# Patient Record
Sex: Female | Born: 1955 | Race: Black or African American | Hispanic: No | Marital: Single | Smoking: Never smoker
Health system: Southern US, Community
[De-identification: ages and names within clinical notes are randomized; demographics above are authoritative.]

## PROBLEM LIST (undated history)

## (undated) DIAGNOSIS — K449 Diaphragmatic hernia without obstruction or gangrene: Secondary | ICD-10-CM

## (undated) DIAGNOSIS — J40 Bronchitis, not specified as acute or chronic: Secondary | ICD-10-CM

## (undated) DIAGNOSIS — F329 Major depressive disorder, single episode, unspecified: Secondary | ICD-10-CM

## (undated) DIAGNOSIS — F32A Depression, unspecified: Secondary | ICD-10-CM

## (undated) DIAGNOSIS — I10 Essential (primary) hypertension: Secondary | ICD-10-CM

## (undated) DIAGNOSIS — D649 Anemia, unspecified: Secondary | ICD-10-CM

## (undated) DIAGNOSIS — E119 Type 2 diabetes mellitus without complications: Secondary | ICD-10-CM

## (undated) DIAGNOSIS — M549 Dorsalgia, unspecified: Secondary | ICD-10-CM

## (undated) DIAGNOSIS — G43909 Migraine, unspecified, not intractable, without status migrainosus: Secondary | ICD-10-CM

## (undated) DIAGNOSIS — M1990 Unspecified osteoarthritis, unspecified site: Secondary | ICD-10-CM

## (undated) DIAGNOSIS — E785 Hyperlipidemia, unspecified: Secondary | ICD-10-CM

## (undated) HISTORY — PX: ABDOMINAL HYSTERECTOMY: SHX81

## (undated) HISTORY — DX: Type 2 diabetes mellitus without complications: E11.9

## (undated) HISTORY — DX: Morbid (severe) obesity due to excess calories: E66.01

## (undated) HISTORY — DX: Migraine, unspecified, not intractable, without status migrainosus: G43.909

## (undated) HISTORY — DX: Bronchitis, not specified as acute or chronic: J40

## (undated) HISTORY — DX: Essential (primary) hypertension: I10

## (undated) HISTORY — DX: Hyperlipidemia, unspecified: E78.5

## (undated) HISTORY — DX: Anemia, unspecified: D64.9

## (undated) HISTORY — PX: TUBAL LIGATION: SHX77

## (undated) HISTORY — DX: Major depressive disorder, single episode, unspecified: F32.9

## (undated) HISTORY — DX: Diaphragmatic hernia without obstruction or gangrene: K44.9

## (undated) HISTORY — DX: Depression, unspecified: F32.A

---

## 2005-12-14 ENCOUNTER — Inpatient Hospital Stay (HOSPITAL_COMMUNITY): Admission: EM | Admit: 2005-12-14 | Discharge: 2005-12-17 | Payer: Self-pay | Admitting: Emergency Medicine

## 2005-12-15 ENCOUNTER — Encounter (INDEPENDENT_AMBULATORY_CARE_PROVIDER_SITE_OTHER): Payer: Self-pay | Admitting: *Deleted

## 2006-01-25 ENCOUNTER — Emergency Department (HOSPITAL_COMMUNITY): Admission: EM | Admit: 2006-01-25 | Discharge: 2006-01-25 | Payer: Self-pay | Admitting: Emergency Medicine

## 2006-03-29 ENCOUNTER — Emergency Department (HOSPITAL_COMMUNITY): Admission: EM | Admit: 2006-03-29 | Discharge: 2006-03-29 | Payer: Self-pay | Admitting: Emergency Medicine

## 2006-07-21 ENCOUNTER — Inpatient Hospital Stay (HOSPITAL_BASED_OUTPATIENT_CLINIC_OR_DEPARTMENT_OTHER): Admission: RE | Admit: 2006-07-21 | Discharge: 2006-07-21 | Payer: Self-pay | Admitting: Cardiology

## 2006-10-02 ENCOUNTER — Encounter: Admission: RE | Admit: 2006-10-02 | Discharge: 2006-12-31 | Payer: Self-pay | Admitting: Family Medicine

## 2006-10-30 ENCOUNTER — Encounter: Payer: Self-pay | Admitting: Vascular Surgery

## 2006-10-30 ENCOUNTER — Ambulatory Visit (HOSPITAL_COMMUNITY): Admission: RE | Admit: 2006-10-30 | Discharge: 2006-10-30 | Payer: Self-pay | Admitting: Cardiology

## 2006-11-17 ENCOUNTER — Encounter: Payer: Self-pay | Admitting: Vascular Surgery

## 2007-02-20 ENCOUNTER — Encounter: Admission: RE | Admit: 2007-02-20 | Discharge: 2007-05-21 | Payer: Self-pay | Admitting: Family Medicine

## 2007-05-22 ENCOUNTER — Encounter: Admission: RE | Admit: 2007-05-22 | Discharge: 2007-05-22 | Payer: Self-pay | Admitting: Family Medicine

## 2007-08-30 ENCOUNTER — Encounter: Admission: RE | Admit: 2007-08-30 | Discharge: 2007-08-30 | Payer: Self-pay | Admitting: Family Medicine

## 2007-12-31 ENCOUNTER — Encounter: Admission: RE | Admit: 2007-12-31 | Discharge: 2007-12-31 | Payer: Self-pay | Admitting: Family Medicine

## 2008-03-14 ENCOUNTER — Ambulatory Visit (HOSPITAL_BASED_OUTPATIENT_CLINIC_OR_DEPARTMENT_OTHER): Admission: RE | Admit: 2008-03-14 | Discharge: 2008-03-14 | Payer: Self-pay | Admitting: Orthopedic Surgery

## 2008-05-07 ENCOUNTER — Encounter: Admission: RE | Admit: 2008-05-07 | Discharge: 2008-05-07 | Payer: Self-pay | Admitting: Family Medicine

## 2010-05-17 LAB — HM COLONOSCOPY

## 2011-01-28 ENCOUNTER — Ambulatory Visit (INDEPENDENT_AMBULATORY_CARE_PROVIDER_SITE_OTHER): Payer: BC Managed Care – PPO | Admitting: Cardiology

## 2011-01-28 DIAGNOSIS — I10 Essential (primary) hypertension: Secondary | ICD-10-CM

## 2011-01-28 DIAGNOSIS — R0789 Other chest pain: Secondary | ICD-10-CM

## 2011-04-12 NOTE — Op Note (Signed)
NAMEJULIAN, ASKIN            ACCOUNT NO.:  [id number]   MEDICAL RECORD NO.:  [id number]          PATIENT TYPE:  AMB   LOCATION:  DSC                          FACILITY:  [facility]   PHYSICIAN:  Harvie Junior, M.D.   DATE OF BIRTH:  07-Sep-1956   DATE OF PROCEDURE:  03/14/2008  DATE OF DISCHARGE:                               OPERATIVE REPORT   PREOPERATIVE DIAGNOSIS:  Medial meniscal tear.   POSTOPERATIVE DIAGNOSES:  1. Medial meniscal tear.  2. Chondromalacia of the patellofemoral joint, particularly      patellofemoral trochlear.   OPERATIVE PROCEDURE:  1. Partial posterior horn medial meniscectomy with corresponding      debridement of the medial femoral compartment.  2. Chondroplasty of the patellofemoral joint, particularly      patellofemoral trochlea.   SURGEON:  Harvie Junior, M.D.   ASSISTANT:  Marshia Ly, P.A.   ANESTHESIA:  General.   BRIEF HISTORY:  Mrs. Griffitts is a 55 year old female with a long history  of having had significant medial joint line complaints.  We had treated  her conservatively for period of time because failure of all  conservative care.  She was also taken to the operating room for  operative knee arthroscopy.  Preoperative MRI showed medial meniscal  tear and this is what we came to address.   PROCEDURE:  The patient was taken to the operating room.  After adequate  anesthesia was obtained with general anesthetic, the patient was placed  supine on the operating table.  Her right leg was prepped and draped in  the usual sterile fashion.  Following this, routine arthroscopic  examination of knee revealed there was an obvious posterior horn medial  meniscal tear which was debrided back and was smoothened with a stable  rim.  Once this completed, the medial femoral condyle was evaluated and  noted to have significant chondromalacia grade 2 in nature.  This was  debrided.  ACL was normal.  Lateral side normal.  Attention was then  turned  up to the patellofemoral joint where there was some grade 2 and  grade 3 changes of the patellofemoral joint, in particular lateral  patellar facet and both medial and lateral femoral trochlear.  This was  debrided via chondroplasty.  There was a large medial plaque which was  debrided all the way around the knee joint.  At this point, the knee was  copiously and thoroughly irrigated and  suctioned dry.  All subportals were closed with a bandage.  Sterile  compression dressing was applied.  The patient was taken to the recovery  room and was noted to be in satisfactory condition.  Estimated blood  loss for this procedure was none.      Harvie Junior, M.D.  Electronically Signed     JLG/MEDQ  D:  03/14/2008  T:  03/15/2008  Job:  [id number]

## 2011-04-15 NOTE — Cardiovascular Report (Signed)
NAMETENIKA, KEERAN            ACCOUNT NO.:  [id number]   MEDICAL RECORD NO.:  [id number]          PATIENT TYPE:  OIB   LOCATION:  1961                         FACILITY:  [facility]   PHYSICIAN:  Colleen Can. Deborah Chalk, M.D.DATE OF BIRTH:  [birth year]   DATE OF PROCEDURE:  07/21/2006  DATE OF DISCHARGE:                              CARDIAC CATHETERIZATION   HISTORY:  Ms. Zehring presents for evaluation of an abnormal adenosine  Cardiolite study with an anterior perfusion defect.  She has a multitude of  cardiovascular risk factors including hypertension, diabetes, previous  stroke and obesity.   PROCEDURE:  Left heart catheterization with selective coronary angiography,  left ventricular angiography with AngioSeal.   TYPE AND SITE OF ENTRY:  Percutaneous, right femoral artery.   CATHETERS:  The 4-French four-curved Judkins right and left coronary  catheters, 4-French pigtail ventriculographic catheter.  We replaced the 4-  [place] system with a 5-French sheath and used Angio-Seal for closure.   CONTRAST MATERIAL:  Omnipaque.   MEDICATIONS GIVEN PRIOR TO PROCEDURE:  Valium 10 mg.   MEDICATIONS GIVEN DURING PROCEDURE:  Versed 4 mg IV.   COMMENTS:  The patient tolerated procedure well.   HEMODYNAMIC DATA:  The aortic pressure was 144/76.  LV is 133/7-12.   ANGIOGRAPHIC DATA:  Left ventricular angiogram was performed in the RAO  position.  The overall cardiac silhouette had somewhat of a slipper-like  appearance with hyperdynamic function in the midportion of the ventricle,  but we could detect no intracavitary gradient.  Overall regional wall motion  was normal.  The global ejection fraction would be 70%.  There is no mitral  regurgitation, intracardiac calcification or intracavitary filling defect.   CORONARY ARTERIES:  The coronary arteries arise and distribute normally.   1. The right coronary artery is a dominant vessel.  It is normal.  2. Left circumflex:  The left circumflex is  a relatively large vessel.  It      continues as a trifurcating posterolateral branch.  It is normal.  3. Left main coronary artery is very short and it is almost a dual ostium      for the left system.  4. Left anterior descending:  The left anterior descending is a long      vessel that wraps around the apex.  There are multiple diagonal      vessels.  The left anterior descending system is normal.   OVERALL IMPRESSION:  1. Normal coronary arteries.  2. Hyperdynamic left ventricle.   DISCUSSION:  It is felt that Mr. Birnie is at low risk from a coronary  standpoint this point in time.  She will need to continue to modify  cardiovascular risk factors, however.      Colleen Can. Deborah Chalk, M.D.  Electronically Signed     SNT/MEDQ  D:  07/21/2006  T:  07/21/2006  Job:  [id number]   cc:   Birdena Jubilee, MD

## 2011-04-15 NOTE — H&P (Signed)
Chelsey Bray, Chelsey Bray            ACCOUNT NO.:  [id number]   MEDICAL RECORD NO.:  [id number]           PATIENT TYPE:   LOCATION:                                 FACILITY:   PHYSICIAN:  Colleen Can. Deborah Chalk, M.D.DATE OF BIRTH:  January 18, 1956   DATE OF ADMISSION:  07/21/2006  DATE OF DISCHARGE:                                HISTORY & PHYSICAL   CHIEF COMPLAINT:  None.   HISTORY OF PRESENT ILLNESS:  The patient is a pleasant 55 year old single  black female who presents for elective cardiac catheterization. She has had  a recent abnormal adenosine Cardiolite study which anterior perfusion  defects with normal LV function. Clinically, she has had no complaints of  chest pain. She has had a recent CVA which involved the right side. She has  had some persistent slower speech. She is now referred for elective cardiac  catheterization.   PAST MEDICAL HISTORY:  1. 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  2. Longstanding hypertension.  3. Diabetes since 1998.  4. Morbid obesity.  5. Migraine headaches.  6. Hiatal hernia.  7. Bronchitis.  8. Scoliosis with Harrington rod placement in 1973.  9. History of tubal ligation.   ALLERGIES:  None.   CURRENT MEDICATIONS:  1. Cozaar 100 mg a day.  2. Topamax 100 mg a day.  3. Pravachol 80 mg a day.  4. Allegra 180 a day.  5. Actos 15 a day.  6. Elavil 25 mg at bedtime.  7. Baby aspirin daily.  8. Atacand 32 mg a day.  9. Atenolol chlorthalidone 50/25 daily.  10.Glucophage 500 mg 4 tablets a day.  11.Januvia 100 mg a day.  12.Potassium 20 mEq a day.  13.Niferex 150 a day.  14.Prilosec 20 b.i.d.  15.Relafen 750 b.i.d.  16.Darvocet-N 100 b.i.d.  17.Flexeril 5 mg at bedtime.   FAMILY HISTORY:  Father died at 20 with heart attack and a stroke. Mother is  living at the age of 50. She has 5 sisters and 3 brothers.   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  She has no alcohol or tobacco use. She lives with her mother.   REVIEW OF SYSTEMS:  Basically as noted above. She has had a history of  migraines and has had some problems with stress and muscle spasms. She has  had no recent chest pain or shortness of breath. She was previously engaged,  and her fiance died in the service which has created significant situational  stress.   PHYSICAL EXAMINATION:  GENERAL:  She is a very pleasant, middle-aged female.  She is in no acute distress.  VITAL SIGNS:  Weight 245 pounds. Blood pressure 110/70 sitting, 120/70  standing, heart rate is 68 and regular.  HEENT:  Unremarkable.  NECK:  Full. No JVD.  LUNGS:  Reasonably clear.  HEART:  Shows a regular rhythm without murmur.  ABDOMEN:  Obese.  EXTREMITIES:  Without edema.  NEUROLOGICAL:  Shows no gross focal deficits.  STUDIES:  EKG shows normal sinus rhythm.   Chest x-ray shows the Harrington rod to be in place. Heart size was normal.  Other labs were pending.   OVERALL IMPRESSION:  1. Abnormal adenosine Cardiolite study.  2. Previous stroke with minimal residual deficit.  3. Hypertensive heart disease.  4. Diabetes.  5. Morbid obesity.  6. Scoliosis.  7. History of migraines.  8. Hyperlipidemia, currently on Pravachol.   PLAN:  We will proceed on with diagnostic cardiac catheterization. Procedure  is reviewed including the risks and benefits, and she is willing to proceed  on Friday, March 21, 2006. Her Glucophage will be discontinued as of  Wednesday, March 19, 2006.      Sharlee Blew, N.P.      Colleen Can. Deborah Chalk, M.D.  Electronically Signed    LC/MEDQ  D:  07/18/2006  T:  07/18/2006  Job:  [id number]   cc:   Birdena Jubilee, MD

## 2011-04-15 NOTE — Discharge Summary (Signed)
Chelsey Bray, Chelsey Bray            ACCOUNT NO.:  [id number]   MEDICAL RECORD NO.:  [id number]          PATIENT TYPE:  INP   LOCATION:  3016                         FACILITY:  [facility]   PHYSICIAN:  Pramod P. Pearlean Brownie, MD    DATE OF BIRTH:  [birth year]   DATE OF ADMISSION:  12/14/2005  DATE OF DISCHARGE:  12/17/2005                                 DISCHARGE SUMMARY   ADMISSION DIAGNOSIS:  Right-sided weakness and shoulder pain.   DISCHARGE DIAGNOSES:  1.  Right shoulder pain and weakness and numbness of unclear etiology,      likely non-organic.  2.  Hypertension.  3.  Diabetes.   HOSPITAL COURSE:  Chelsey Bray is a 55 year old African-American lady who was  admitted with sudden onset of pain and tingling on the right side of the  neck and radiating to down the upper extremity as well as heaviness, some  speech difficulties, facial droop, right-sided weakness and numbness. EMS  was called and code stroke was called en route.  On evaluation in emergency  room, initial CT scan was negative for bleed and pain. She had persistent  right-sided weakness and numbness and qualified for tPA. It was  administered. The patient symptoms, however, persisted. The tPA was  administered without any complications. Follow-up CT scan in 24 hours did  not show any hemorrhage. Subsequently, an MRI scan of the brain was obtained  which showed no definite infarct. MRA showed no intracranial high-grade  stenosis. 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 The patient had persistent right-sided numbness during  the hospitalization. She did not clearly split the midline. However, she had  involvement of the forehead as well. She had giveaway weakness in the right  side but there was no definite objective weakness noted. Hoover's sign was  positive. The patient's weakness and numbness were thought to be non-organic  in nature. The rest of the stroke workup included a 2-D  echocardiogram which  showed normal ejection fraction without obvious cardiac source of embolism.  Carotid Doppler showed only mild 40-60% right ICA stenosis. Hemoglobin A1c  was significantly elevated at 8.4. Homocystine was normal at 9. Lipid  profile was normal. UA showed 11-20 red cells with no WBC's. Urine drug  screen was negative. The patient was continued on aspirin for stroke  prevention. She was counseled to maintain tight control of her diabetes and  blood pressure. She was seen on consult by physical, occupational therapy  and was thought to be safe to be discharged home with some home PT and OT.  The patient was discharged home in stable condition on December 17, 2005 and  advised to follow up with her primary physician in two weeks. She was  advised to return to work after one week.   DISCHARGE MEDICATIONS:  1.  Aspirin 81 milligrams a day.  2.  Actos 15 milligrams a day.  3.  Lisinopril 40 milligrams a day.  4.  Nexium 40 milligrams a day.  5.  Metformin 850 milligrams daily.  6.  Atenolol 50 milligrams daily.  7.  Chlorthalidone 25  milligrams a day.  8.  Potassium 10 mEq daily.  9.  Ferrex 150 milligrams a day.   She was advised to increase outpatient stress relaxation activities and lose  weight and maintain diet-controlled diabetes. Arrangements were made for  home health PT and OT.           ______________________________  Sunny Schlein. Pearlean Brownie, MD     PPS/MEDQ  D:  12/17/2005  T:  12/18/2005  Job:  [id number]   cc:   Birdena Jubilee, MD  Fax: [phone]

## 2011-08-23 LAB — BASIC METABOLIC PANEL
Calcium: 10
Chloride: 102
Potassium: 4.6

## 2012-11-06 LAB — HM MAMMOGRAPHY

## 2013-03-14 ENCOUNTER — Telehealth: Payer: Self-pay | Admitting: *Deleted

## 2013-03-14 NOTE — Telephone Encounter (Signed)
PT CALLED SCHED APPT ON THE 22. TRIED TO GET IN ON THE 21ST SHE DID NOT WANT TO COME IN THEN. PT COMPLAINT VERTIGO TOLD PT IF GOT WORSE SHE NEEDED TO GO TO URGENT CARE OR ED.

## 2013-03-19 ENCOUNTER — Encounter: Payer: Self-pay | Admitting: Family Medicine

## 2013-03-19 ENCOUNTER — Ambulatory Visit (INDEPENDENT_AMBULATORY_CARE_PROVIDER_SITE_OTHER): Payer: BC Managed Care – PPO | Admitting: Family Medicine

## 2013-03-19 VITALS — BP 154/83 | HR 86 | Ht 63.5 in | Wt 252.0 lb

## 2013-03-19 DIAGNOSIS — R42 Dizziness and giddiness: Secondary | ICD-10-CM

## 2013-03-19 DIAGNOSIS — I10 Essential (primary) hypertension: Secondary | ICD-10-CM

## 2013-03-19 DIAGNOSIS — IMO0001 Reserved for inherently not codable concepts without codable children: Secondary | ICD-10-CM

## 2013-03-19 DIAGNOSIS — M7989 Other specified soft tissue disorders: Secondary | ICD-10-CM

## 2013-03-19 MED ORDER — DAPAGLIFLOZIN PROPANEDIOL 10 MG PO TABS: 1.0000 | ORAL_TABLET | Freq: Every day | ORAL | Status: DC

## 2013-03-19 MED ORDER — SITAGLIP PHOS-METFORMIN HCL ER 50-1000 MG PO TB24: 1.0000 | ORAL_TABLET | Freq: Two times a day (BID) | ORAL | Status: DC

## 2013-03-19 MED ORDER — FUROSEMIDE 40 MG PO TABS: 40.0000 mg | ORAL_TABLET | Freq: Every day | ORAL | Status: DC

## 2013-03-19 MED ORDER — MECLIZINE HCL 25 MG PO TABS: 25.0000 mg | ORAL_TABLET | Freq: Three times a day (TID) | ORAL | Status: DC | PRN

## 2013-03-19 MED ORDER — GLUCOSE BLOOD VI STRP: ORAL_STRIP | Status: AC

## 2013-03-19 MED ORDER — IRBESARTAN-HYDROCHLOROTHIAZIDE 300-12.5 MG PO TABS: 1.0000 | ORAL_TABLET | Freq: Every day | ORAL | Status: DC

## 2013-03-19 NOTE — Progress Notes (Signed)
Subjective:     Patient ID: Chelsey Bray, female   DOB: 1956/06/17, 57 y.o.   MRN: [id number]  HPI Chelsey Bray is here today to discuss a few issues.  1)  Vertigo:  She has been feeling very dizzy over the past few days.  She feels that her dizziness is worse when she is laying flat.    2)  Foot Swelling:  She is having more swelling than normal in both feet.  She doesn't feel that the Lasix is working as well as it has in the past. She admits that she has not been watching her diet.    3)  Blood Sugar:  She is taking Janumet twice a day along with Novolog and Levemir. She is confused about how she is really supposed to be taking her insulins.    Review of Systems  Constitutional: Positive for fatigue. Negative for appetite change and unexpected weight change.  HENT: Negative for congestion.   Eyes: Negative.   Respiratory: Positive for shortness of breath.   Cardiovascular: Positive for leg swelling.  Gastrointestinal: Negative.   Endocrine: Positive for polydipsia and polyuria.  Genitourinary: Negative for difficulty urinating.  Skin: Negative for rash.  Neurological: Positive for dizziness and headaches.  Psychiatric/Behavioral: Positive for sleep disturbance and dysphoric mood.   Past Medical History  Diagnosis Date  . Diabetes mellitus without complication   . Hypertension   . Anemia   . Bronchitis   . Morbid obesity   . Hiatal hernia   . Migraine headache   . Depression   . Hyperlipidemia    Family History  Problem Relation Age of Onset  . Diabetes Mother   . Heart disease Father   . 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 No distress.  HENT:  Head: Normocephalic.  Eyes: No scleral icterus.  Neck: No thyromegaly present.  Cardiovascular: Normal rate, regular rhythm and normal heart sounds.   Pulmonary/Chest: Effort normal and breath sounds normal.  Abdominal: There is no tenderness.  Musculoskeletal: She exhibits edema. She exhibits no tenderness.  Neurological: She is alert.  Skin: Skin is warm and dry.  Psychiatric: She has a normal mood and affect. Her behavior is normal. Judgment and thought content normal.       Assessment:     Dizziness Type II Diabetes Hypertension Swelling of Limb   Plan:     1)  She is going to add some [place] to her Janumet and insulin.    2)  She was given a prescription for Avalide.  3)  Refilled her Lasix for her leg swelling.    4)  Rx given for Antivert.    TIME 30 MINUTES:  MORE THAN 50 % OF TIME WAS INVOLVED IN COUNSELING.

## 2013-03-19 NOTE — Patient Instructions (Addendum)
1)  Diabetes -   Insulin   A)  Levemir/Lantus  - Is a long acting insulin. You take typically take it once a day at night.  You should probably be on about 50+ units at night.  Start with 20 units and then increase by 3 units every 3 days till your first morning sugar is <120.     B) Novolog - Is a short acting insulin.  You take this before meals and if you notice that your sugars are really high.  A typical dosage is 10-20 units with your meals.    We are going to schedule you with a diabetic educator to help you better control your sugars and to discuss the V-Go system  We'll change you to the Janumet XR 50/1000 and add some Farxiga 5 mg then increase to 10 mg.    2)   Leg Swelling -  Decrease your salt intake; We'll switch you from Exforge to Benicar HCT 40/12.5.  Once the Benicar HCT is gone, get the Avalide 300/12.5 filled. Take Lasix 40 mg per day.  Limit your sodium to <2000 mg per day.     2 Gram Low Sodium Diet A 2 gram sodium diet restricts the amount of sodium in the diet to no more than 2 g or 2000 mg daily. Limiting the amount of sodium is often used to help lower blood pressure. It is important if you have heart, liver, or kidney problems. Many foods contain sodium for flavor and sometimes as a preservative. When the amount of sodium in a diet needs to be low, it is important to know what to look for when choosing foods and drinks. The following includes some information and guidelines to help make it easier for you to adapt to a low sodium diet. QUICK TIPS  Do not add salt to food.  Avoid convenience items and fast food.  Choose unsalted snack foods.  Buy lower sodium products, often labeled as "lower sodium" or "no salt added."  Check food labels to learn how much sodium is in 1 serving.  When eating at a restaurant, ask that your food be prepared with less salt or none, if possible. READING FOOD LABELS FOR SODIUM INFORMATION The nutrition facts label is a good place  to find how much sodium is in foods. Look for products with no more than 500 to 600 mg of sodium per meal and no more than 150 mg per serving. Remember that 2 g = 2000 mg. The food label may also list foods as:  Sodium-free: Less than 5 mg in a serving.  Very low sodium: 35 mg or less in a serving.  Low-sodium: 140 mg or less in a serving.  Light in sodium: 50% less sodium in a serving. For example, if a food that usually has 300 mg of sodium is changed to become light in sodium, it will have 150 mg of sodium.  Reduced sodium: 25% less sodium in a serving. For example, if a food that usually has 400 mg of sodium is changed to reduced sodium, it will have 300 mg of sodium. CHOOSING FOODS Grains  Avoid: Salted crackers and snack items. Some cereals, including instant hot cereals. Bread stuffing and biscuit mixes. Seasoned rice or pasta mixes.  Choose: Unsalted snack items. Low-sodium cereals, oats, puffed wheat and rice, shredded wheat. English muffins and bread. Pasta. Meats  Avoid: Salted, canned, smoked, spiced, pickled meats, including fish and poultry. Bacon, ham, sausage, cold cuts, hot dogs,  anchovies.  Choose: Low-sodium canned tuna and salmon. Fresh or frozen meat, poultry, and fish. Dairy  Avoid: Processed cheese and spreads. Cottage cheese. Buttermilk and condensed milk. Regular cheese.  Choose: Milk. Low-sodium cottage cheese. Yogurt. Sour cream. Low-sodium cheese. Fruits and Vegetables  Avoid: Regular canned vegetables. Regular canned tomato sauce and paste. Frozen vegetables in sauces. Olives. Rosita Fire. Relishes. Sauerkraut.  Choose: Low-sodium canned vegetables. Low-sodium tomato sauce and paste. Frozen or fresh vegetables. Fresh and frozen fruit. Condiments  Avoid: Canned and packaged gravies. Worcestershire sauce. Tartar sauce. Barbecue sauce. Soy sauce. Steak sauce. Ketchup. Onion, garlic, and table salt. Meat flavorings and tenderizers.  Choose: Fresh and dried  herbs and spices. Low-sodium varieties of mustard and ketchup. Lemon juice. Tabasco sauce. Horseradish. SAMPLE 2 GRAM SODIUM MEAL PLAN Breakfast / Sodium (mg)  1 cup low-fat milk / 143 mg  2 slices whole-wheat toast / 270 mg  1 tbs heart-healthy margarine / 153 mg  1 hard-boiled egg / 139 mg  1 small orange / 0 mg Lunch / Sodium (mg)  1 cup raw carrots / 76 mg   cup hummus / 298 mg  1 cup low-fat milk / 143 mg   cup red grapes / 2 mg  1 whole-wheat pita bread / 356 mg Dinner / Sodium (mg)  1 cup whole-wheat pasta / 2 mg  1 cup low-sodium tomato sauce / 73 mg  3 oz lean ground beef / 57 mg  1 small side salad (1 cup raw spinach leaves,  cup cucumber,  cup yellow bell pepper) with 1 tsp olive oil and 1 tsp red wine vinegar / 25 mg Snack / Sodium (mg)  1 container low-fat vanilla yogurt / 107 mg  3 graham cracker squares / 127 mg Nutrient Analysis  Calories: 2033  Protein: 77 g  Carbohydrate: 282 g  Fat: 72 g  Sodium: 1971 mg Document Released: 11/14/2005 Document Revised: 02/06/2012 Document Reviewed: 02/15/2010 [facility] Patient Information 2013 [place], [place]. Diabetes Meal Planning Guide The diabetes meal planning guide is a tool to help you plan your meals and snacks. It is important for people with diabetes to manage their blood glucose (sugar) levels. Choosing the right foods and the right amounts throughout your day will help control your blood glucose. Eating right can even help you improve your blood pressure and reach or maintain a healthy weight. CARBOHYDRATE COUNTING MADE EASY When you eat carbohydrates, they turn to sugar. This raises your blood glucose level. Counting carbohydrates can help you control this level so you feel better. When you plan your meals by counting carbohydrates, you can have more flexibility in what you eat and balance your medicine with your food intake. Carbohydrate counting simply means adding up the total amount of  carbohydrate grams in your meals and snacks. Try to eat about the same amount at each meal. Foods with carbohydrates are listed below. Each portion below is 1 carbohydrate serving or 15 grams of carbohydrates. Ask your dietician how many grams of carbohydrates you should eat at each meal or snack. Grains and Starches  1 slice bread.   English muffin or hotdog/hamburger bun.   cup cold cereal (unsweetened).   cup cooked pasta or rice.   cup starchy vegetables (corn, potatoes, peas, beans, winter squash).  1 tortilla (6 inches).   bagel.  1 waffle or pancake (size of a CD).   cup cooked cereal.  4 to 6 small crackers. *Whole grain is recommended. Fruit  1 cup fresh unsweetened berries,  melon, papaya, pineapple.  1 small fresh fruit.   banana or mango.   cup fruit juice (4 oz unsweetened).   cup canned fruit in natural juice or water.  2 tbs dried fruit.  12 to 15 grapes or cherries. Milk and Yogurt  1 cup fat-free or 1% milk.  1 cup soy milk.  6 oz light yogurt with sugar-free sweetener.  6 oz low-fat soy yogurt.  6 oz plain yogurt. Vegetables  1 cup raw or  cup cooked is counted as 0 carbohydrates or a "free" food.  If you eat 3 or more servings at 1 meal, count them as 1 carbohydrate serving. Other Carbohydrates   oz chips or pretzels.   cup ice cream or frozen yogurt.   cup sherbet or sorbet.  2 inch square cake, no frosting.  1 tbs honey, sugar, jam, jelly, or syrup.  2 small cookies.  3 squares of graham crackers.  3 cups popcorn.  6 crackers.  1 cup broth-based soup.  Count 1 cup casserole or other mixed foods as 2 carbohydrate servings.  Foods with less than 20 calories in a serving may be counted as 0 carbohydrates or a "free" food. You may want to purchase a book or computer software that lists the carbohydrate gram counts of different foods. In addition, the nutrition facts panel on the labels of the foods you eat are  a good source of this information. The label will tell you how big the serving size is and the total number of carbohydrate grams you will be eating per serving. Divide this number by 15 to obtain the number of carbohydrate servings in a portion. Remember, 1 carbohydrate serving equals 15 grams of carbohydrate. SERVING SIZES Measuring foods and serving sizes helps you make sure you are getting the right amount of food. The list below tells how big or small some common serving sizes are.  1 oz.........4 stacked dice.  3 oz........Marland KitchenDeck of cards.  1 tsp.......Marland KitchenTip of little finger.  1 tbs......Marland KitchenMarland KitchenThumb.  2 tbs.......Marland KitchenGolf ball.   cup......Marland KitchenHalf of a fist.  1 cup.......Marland KitchenA fist. SAMPLE DIABETES MEAL PLAN Below is a sample meal plan that includes foods from the grain and starches, dairy, vegetable, fruit, and meat groups. A dietician can individualize a meal plan to fit your calorie needs and tell you the number of servings needed from each food group. However, controlling the total amount of carbohydrates in your meal or snack is more important than making sure you include all of the food groups at every meal. You may interchange carbohydrate containing foods (dairy, starches, and fruits). The meal plan below is an example of a 2000 calorie diet using carbohydrate counting. This meal plan has 17 carbohydrate servings. Breakfast  1 cup oatmeal (2 carb servings).   cup light yogurt (1 carb serving).  1 cup blueberries (1 carb serving).   cup almonds. Snack  1 large apple (2 carb servings).  1 low-fat string cheese stick. Lunch  Chicken breast salad.  1 cup spinach.   cup chopped tomatoes.  2 oz chicken breast, sliced.  2 tbs low-fat [place] dressing.  12 whole-wheat crackers (2 carb servings).  12 to 15 grapes (1 carb serving).  1 cup low-fat milk (1 carb serving). Snack  1 cup carrots.   cup hummus (1 carb serving). Dinner  3 oz broiled salmon.  1 cup brown  rice (3 carb servings). Snack  1  cups steamed broccoli (1 carb serving) drizzled with 1 tsp olive oil  and lemon juice.  1 cup light pudding (2 carb servings). DIABETES MEAL PLANNING WORKSHEET Your dietician can use this worksheet to help you decide how many servings of foods and what types of foods are right for you.  BREAKFAST Food Group and Servings / Carb Servings Grain/Starches __________________________________ Dairy __________________________________________ Vegetable ______________________________________ Fruit ___________________________________________ Meat __________________________________________ Fat ____________________________________________ LUNCH Food Group and Servings / Carb Servings Grain/Starches ___________________________________ Dairy ___________________________________________ Fruit ____________________________________________ Meat ___________________________________________ Fat _____________________________________________ Laural Golden Food Group and Servings / Carb Servings Grain/Starches ___________________________________ Dairy ___________________________________________ Fruit ____________________________________________ Meat ___________________________________________ Fat _____________________________________________ SNACKS Food Group and Servings / Carb Servings Grain/Starches ___________________________________ Dairy ___________________________________________ Vegetable _______________________________________ Fruit ____________________________________________ Meat ___________________________________________ Fat _____________________________________________ DAILY TOTALS Starches _________________________ Vegetable ________________________ Fruit ____________________________ Dairy ____________________________ Meat ____________________________ Fat ______________________________ Document Released: 08/11/2005 Document Revised: 02/06/2012 Document  Reviewed: 06/22/2009 [employer] Patient Information 2013 [place], [place].

## 2013-03-26 ENCOUNTER — Encounter: Payer: Self-pay | Admitting: Family Medicine

## 2013-03-26 DIAGNOSIS — IMO0001 Reserved for inherently not codable concepts without codable children: Secondary | ICD-10-CM | POA: Insufficient documentation

## 2013-03-26 DIAGNOSIS — M7989 Other specified soft tissue disorders: Secondary | ICD-10-CM | POA: Insufficient documentation

## 2013-03-26 DIAGNOSIS — R42 Dizziness and giddiness: Secondary | ICD-10-CM | POA: Insufficient documentation

## 2013-03-26 DIAGNOSIS — I10 Essential (primary) hypertension: Secondary | ICD-10-CM | POA: Insufficient documentation

## 2013-07-01 ENCOUNTER — Encounter: Payer: Self-pay | Admitting: Family Medicine

## 2013-07-01 ENCOUNTER — Ambulatory Visit: Payer: BC Managed Care – PPO | Admitting: Family Medicine

## 2013-07-01 ENCOUNTER — Ambulatory Visit (INDEPENDENT_AMBULATORY_CARE_PROVIDER_SITE_OTHER): Payer: BC Managed Care – PPO | Admitting: Family Medicine

## 2013-07-01 VITALS — BP 131/86 | HR 54 | Wt 250.0 lb

## 2013-07-01 DIAGNOSIS — M79601 Pain in right arm: Secondary | ICD-10-CM

## 2013-07-01 DIAGNOSIS — M79609 Pain in unspecified limb: Secondary | ICD-10-CM

## 2013-07-01 DIAGNOSIS — M542 Cervicalgia: Secondary | ICD-10-CM

## 2013-07-01 DIAGNOSIS — R079 Chest pain, unspecified: Secondary | ICD-10-CM

## 2013-07-01 MED ORDER — METHYLPREDNISOLONE SODIUM SUCC 125 MG IJ SOLR
125.0000 mg | Freq: Once | INTRAMUSCULAR | Status: AC
  Administered 2013-07-01: 125 mg via INTRAMUSCULAR

## 2013-07-01 MED ORDER — CYCLOBENZAPRINE HCL 10 MG PO TABS: ORAL_TABLET | ORAL | Status: DC

## 2013-07-01 MED ORDER — DICLOFENAC SODIUM 1 % TD GEL: 4.0000 g | Freq: Four times a day (QID) | TRANSDERMAL | Status: AC

## 2013-07-01 MED ORDER — KETOROLAC TROMETHAMINE 60 MG/2ML IM SOLN
60.0000 mg | Freq: Once | INTRAMUSCULAR | Status: AC
  Administered 2013-07-01: 60 mg via INTRAMUSCULAR

## 2013-07-01 NOTE — Progress Notes (Signed)
  Subjective:    Patient ID: Chelsey Bray, female    DOB: [birth year], 57 y.o.   MRN: [id number]  HPI  Chelsey Bray is here today complaining of right arm pain and stomach pain with nausea. She woke up this morning feeling this way. She took some Aleve for her pain which has helped. She has had some mild chest pain with some shortness of breath for about 2-3 days. She is not taking most of her regular medications due the cost.    Review of Systems  Respiratory: Positive for shortness of breath.   Cardiovascular: Positive for chest pain.  Gastrointestinal: Positive for nausea and abdominal pain.  Musculoskeletal:       Right Arm Pain    Past Medical History  Diagnosis Date  . Diabetes mellitus without complication   . Hypertension   . Anemia   . Bronchitis   . Morbid obesity   . Hiatal hernia   . Migraine headache   . Depression   . Hyperlipidemia     Family History  Problem Relation Age of Onset  . Diabetes Mother   . Heart disease Father   . 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 Constitutional: She is oriented to person, place, and time.  Eyes: Conjunctivae are normal. No scleral icterus.  Neck: Neck supple. No thyromegaly present.  Cardiovascular: Normal rate, regular rhythm and normal heart sounds.   Pulmonary/Chest: Effort normal and breath sounds normal.  Musculoskeletal: She exhibits no edema and no tenderness.  Lymphadenopathy:    She has no cervical adenopathy.  Neurological: She is alert and oriented to person,  place, and time.  Skin: Skin is warm and dry.  Psychiatric: She has a normal mood and affect. Her behavior is normal. Judgment and thought content normal.          Assessment & Plan:

## 2013-07-09 ENCOUNTER — Other Ambulatory Visit: Payer: Self-pay | Admitting: *Deleted

## 2013-07-09 DIAGNOSIS — I10 Essential (primary) hypertension: Secondary | ICD-10-CM

## 2013-07-09 DIAGNOSIS — E559 Vitamin D deficiency, unspecified: Secondary | ICD-10-CM

## 2013-07-09 DIAGNOSIS — R5383 Other fatigue: Secondary | ICD-10-CM

## 2013-07-09 DIAGNOSIS — E785 Hyperlipidemia, unspecified: Secondary | ICD-10-CM

## 2013-07-10 ENCOUNTER — Other Ambulatory Visit: Payer: BC Managed Care – PPO

## 2013-07-10 LAB — CBC WITH DIFFERENTIAL/PLATELET
Basophils Absolute: 0 10*3/uL (ref 0.0–0.1)
Basophils Relative: 0 % (ref 0–1)
Eosinophils Absolute: 0.1 10*3/uL (ref 0.0–0.7)
Eosinophils Relative: 1 % (ref 0–5)
HCT: 38.6 % (ref 36.0–46.0)
Hemoglobin: 13.1 g/dL (ref 12.0–15.0)
Lymphocytes Relative: 48 % — ABNORMAL HIGH (ref 12–46)
Lymphs Abs: 4.1 10*3/uL — ABNORMAL HIGH (ref 0.7–4.0)
MCH: 29.9 pg (ref 26.0–34.0)
MCHC: 33.9 g/dL (ref 30.0–36.0)
MCV: 88.1 fL (ref 78.0–100.0)
Monocytes Absolute: 0.4 10*3/uL (ref 0.1–1.0)
Monocytes Relative: 5 % (ref 3–12)
Neutro Abs: 4 10*3/uL (ref 1.7–7.7)
Neutrophils Relative %: 46 % (ref 43–77)
Platelets: 230 10*3/uL (ref 150–400)
RBC: 4.38 MIL/uL (ref 3.87–5.11)
RDW: 13.8 % (ref 11.5–15.5)
WBC: 8.6 10*3/uL (ref 4.0–10.5)

## 2013-07-10 LAB — COMPLETE METABOLIC PANEL WITH GFR
ALT: 13 U/L (ref 0–35)
AST: 11 U/L (ref 0–37)
Albumin: 4 g/dL (ref 3.5–5.2)
Alkaline Phosphatase: 138 U/L — ABNORMAL HIGH (ref 39–117)
BUN: 12 mg/dL (ref 6–23)
CO2: 28 mEq/L (ref 19–32)
Calcium: 9.6 mg/dL (ref 8.4–10.5)
Chloride: 100 mEq/L (ref 96–112)
Creat: 0.88 mg/dL (ref 0.50–1.10)
GFR, Est African American: 85 mL/min
GFR, Est Non African American: 74 mL/min
Glucose, Bld: 435 mg/dL — ABNORMAL HIGH (ref 70–99)
Potassium: 4.5 mEq/L (ref 3.5–5.3)
Sodium: 136 mEq/L (ref 135–145)
Total Bilirubin: 0.4 mg/dL (ref 0.3–1.2)
Total Protein: 7.2 g/dL (ref 6.0–8.3)

## 2013-07-10 LAB — LIPID PANEL
Cholesterol: 182 mg/dL (ref 0–200)
HDL: 34 mg/dL — ABNORMAL LOW (ref >39)
LDL Cholesterol: 110 mg/dL — ABNORMAL HIGH (ref 0–99)
Total CHOL/HDL Ratio: 5.4 Ratio
Triglycerides: 188 mg/dL — ABNORMAL HIGH (ref <150)
VLDL: 38 mg/dL (ref 0–40)

## 2013-07-10 LAB — TSH: TSH: 1.477 u[IU]/mL (ref 0.350–4.500)

## 2013-07-11 LAB — VITAMIN D 25 HYDROXY (VIT D DEFICIENCY, FRACTURES): Vit D, 25-Hydroxy: 31 ng/mL (ref 30–89)

## 2013-07-30 ENCOUNTER — Ambulatory Visit (INDEPENDENT_AMBULATORY_CARE_PROVIDER_SITE_OTHER): Payer: BC Managed Care – PPO | Admitting: Family Medicine

## 2013-07-30 ENCOUNTER — Ambulatory Visit: Payer: BC Managed Care – PPO | Admitting: Family Medicine

## 2013-07-30 ENCOUNTER — Encounter: Payer: Self-pay | Admitting: Family Medicine

## 2013-07-30 VITALS — BP 143/83 | HR 83 | Wt 250.0 lb

## 2013-07-30 DIAGNOSIS — I10 Essential (primary) hypertension: Secondary | ICD-10-CM

## 2013-07-30 DIAGNOSIS — E785 Hyperlipidemia, unspecified: Secondary | ICD-10-CM

## 2013-07-30 DIAGNOSIS — F411 Generalized anxiety disorder: Secondary | ICD-10-CM

## 2013-07-30 DIAGNOSIS — IMO0001 Reserved for inherently not codable concepts without codable children: Secondary | ICD-10-CM

## 2013-07-30 DIAGNOSIS — M542 Cervicalgia: Secondary | ICD-10-CM

## 2013-07-30 DIAGNOSIS — J45909 Unspecified asthma, uncomplicated: Secondary | ICD-10-CM

## 2013-07-30 DIAGNOSIS — Z23 Encounter for immunization: Secondary | ICD-10-CM

## 2013-07-30 DIAGNOSIS — R609 Edema, unspecified: Secondary | ICD-10-CM

## 2013-07-30 LAB — POCT GLYCOSYLATED HEMOGLOBIN (HGB A1C): Hemoglobin A1C: 14

## 2013-07-30 MED ORDER — VILAZODONE HCL 40 MG PO TABS: 40.0000 mg | ORAL_TABLET | Freq: Every day | ORAL | Status: DC

## 2013-07-30 MED ORDER — INSULIN ASPART PROT & ASPART (70-30 MIX) 100 UNIT/ML PEN: PEN_INJECTOR | SUBCUTANEOUS | Status: DC

## 2013-07-30 MED ORDER — CYCLOBENZAPRINE HCL 10 MG PO TABS: ORAL_TABLET | ORAL | Status: AC

## 2013-07-30 MED ORDER — AMLODIPINE BESYLATE-VALSARTAN 5-320 MG PO TABS: 1.0000 | ORAL_TABLET | Freq: Every day | ORAL | Status: AC

## 2013-07-30 MED ORDER — FUROSEMIDE 40 MG PO TABS: 40.0000 mg | ORAL_TABLET | Freq: Every day | ORAL | Status: DC

## 2013-07-30 MED ORDER — PRAVASTATIN SODIUM 40 MG PO TABS: 40.0000 mg | ORAL_TABLET | Freq: Every evening | ORAL | Status: AC

## 2013-07-30 MED ORDER — INSULIN PEN NEEDLE 32G X 6 MM MISC: Status: AC

## 2013-07-30 MED ORDER — ALBUTEROL SULFATE HFA 108 (90 BASE) MCG/ACT IN AERS: 2.0000 | INHALATION_SPRAY | Freq: Four times a day (QID) | RESPIRATORY_TRACT | Status: DC | PRN

## 2013-07-30 NOTE — Progress Notes (Signed)
  Subjective:    Patient ID: Chelsey Bray, female    DOB: 1956/07/18, 57 y.o.   MRN: [id number]  HPI  Chelsey Bray is here today to follow up on her recent lab results and to discuss the following conditions:      1)  Type II DM: She just started back taking her Janumet. She was off most of all her meds all summer due to cost. Her A1C today was 14.0.   2)  Hypertension: She is taking her Exforge every other day due to not having money to pay for it.  Her BP is elevated today but she had a stressful day at work.   3)  Asthma: She is needing to get a refill on her Proair. She has been out of it for a while and feels that she needs to have one on hand.     Review of Systems  Constitutional: Negative.   HENT: Negative.   Eyes: Negative.   Respiratory: Negative.   Cardiovascular: Negative.   Gastrointestinal: Negative.   Endocrine: Negative for polydipsia, polyphagia and polyuria.  Allergic/Immunologic: Negative.   Neurological: Negative.   Hematological: Negative.   Psychiatric/Behavioral: Negative.      Past Medical History  Diagnosis Date  . Diabetes mellitus without complication   . Hypertension   . Anemia   . Bronchitis   . Morbid obesity   . Hiatal hernia   . Migraine headache   . Depression   . Hyperlipidemia      Family History  Problem Relation Age of Onset  . Diabetes Mother   . Heart disease Father   . 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 Constitutional: She is oriented to person, place, and time.   Eyes: Conjunctivae are normal. No scleral icterus.  Neck: Neck supple. No thyromegaly present.  Cardiovascular: Normal rate, regular rhythm and normal heart sounds.   Pulmonary/Chest: Effort normal and breath sounds normal.  Musculoskeletal: She exhibits no edema and no tenderness.  Lymphadenopathy:    She has no cervical adenopathy.  Neurological: She is alert and oriented to person, place, and time.  Skin: Skin is warm and dry.  Psychiatric: She has a normal mood and affect. Her behavior is normal. Judgment and thought content normal.          Assessment & Plan:

## 2013-07-30 NOTE — Patient Instructions (Addendum)
1)  Blood Sugar - Take 2 Janumet and 1 Invokana daily until you get paid then switch over to the Novolog 70/30 twice a day.  Start with 20 units twice a day and increase up to 50 units to get your sugars under control. You have to check your blood sugar to know what they are to see if you need to increase your dosage.  We are going to try to get your sugar under control with just insulin.    2)  BP - Take the Exforge daily.  3)  Cholesterol - Take 20 mg of pravastatin daily.    Insulin Treatment in Diabetes Diabetes is a lasting (chronic) disease. It occurs when the body does not properly use the sugar (glucose) that is released from food. Glucose levels are controlled by a hormone called insulin, which is made by your pancreas. Depending on the type of diabetes you have, either:  The pancreas does not make any insulin (type 1 diabetes).  The pancreas makes too little insulin, and the body cannot respond normally to the insulin that is made (type 2 diabetes). Without insulin, death can occur. Diabetes requires lifelong monitoring and treatment. This document will discuss the role of insulin in your treatment and provide information about insulin.  LIFESTYLE CHOICES Lifestyle choices can affect both the control of your diabetes and your risk of complications from diabetes. Lifestyle choices are critically important in the overall management of diabetes. They are important even if you do not need to take insulin.  Eat a healthy diet. Ask for help if you need it.  Exercise regularly. Ask for help if you need it.  Diet and exercise can help:  Reduce the amount of insulin you need.  Your body to use your insulin more effectively.  Reduce your risk of high blood pressure (or reduce your blood pressure, if it is high).  Reduce your cholesterol level.  Reduce your weight. Healthy lifestyle choices play an important role in controlling cardiovascular disease (heart attack, stroke, vascular  disease, and others), which is a primary complication of diabetes. For optimal control of diabetes, you must reduce your cardiovascular risks and manage your blood sugar. MEDICATIONS BESIDES INSULIN  Your caregiver may recommend medicines for you in addition to insulin. This will depend on 3 things:   Your diabetes type.  How well insulin alone meets your treatment goals.  Other health factors. There are different types of medicines that help treat diabetes. The goal is to control your blood sugar the best way possible, which will reduce your risk of complications. Adding other medicines may also reduce the amount of insulin you need.  INSULIN  People with type 1 diabetes must take insulin to stay alive. Their body does not produce it. People with type 2 diabetes might require insulin in addition to, or instead of, other medicines. In either case, proper use of insulin is critical to control your diabetes.  There are a number of different types of insulin. Usually, you will give yourself injections, though others can be trained to give them to you. Some people have an insulin pump that delivers insulin continuously through a soft, flexible tube (canula) that is placedunder the skin of the abdomen.Other sites including the hips, thighs, or upper arms may also be used.  Using insulin requires that you check your blood sugar several times a day. The exact number of times and time of day to check your blood sugar will vary depending on your type of  diabetes, your type of insulin, and treatment goals. Your caregiver will direct you.  Generally, different insulins have different properties. The following is a general guide. Specifics will vary by product, and new products are introduced periodically.   Short-acting insulin starts working quickly (in as little as 5 minutes) and wears off in 3 to 6 hours (sometimes longer). This type of insulin works well when taken before a meal to bring your blood sugar  quickly back to normal. There are several different types of short-acting insulin. Some work quickly and others last longer in your system.  Intermediate-acting insulin starts working in 2 hours and wears off after about 10 to18 hours. This insulin will lower your blood sugar for a longer period of time, but will not be as effective in lowering your blood sugar right after a meal.  Long-acting insulin mimics the small amount of insulin that your pancreas usually produces throughout the day. You need to have some insulin present at all times, as it is crucial to the metabolism of brain cells and other cells. Long-acting insulin is meant to be used either once or twice a day. It is usually used in combination with other types of insulin, or in combination with other diabetes medicines. Discuss the type of insulin you are taking with your caregiver or pharmacist. You will then be aware of when the insulin can be expected to peak and when it will wear off.  Your caregiver will usually have a strategy in mind when treating you with insulin. This will vary with your type of diabetes, your diabetes treatment goals, and your health history. It is important that you understand something about this strategy so you may be a partner in treating your diabetes. Here are some terms you might hear:  Basal insulin. You need to have a small amount of insulin present in your blood at all times. Sometimes oral medicines will be enough. For other people, and especially for people with type 1 diabetes, insulin is needed. Usually, intermediate-acting or long-acting insulin is used once or twice a day to accomplish this.  Prandial (meal-related) insulin. Your blood sugar will rise rapidly after a meal. Short-acting insulin can be used right before the meal to bring your blood sugar back to normal quickly. You might be instructed to adjust the amount of insulin depending on how much carbohydrate (starch) is in your  meal.  Corrective insulin. You might be instructed to check your blood sugar at certain times of the day. You then might use a small amount of short-acting insulin to bring the blood sugar down to normal if it is elevated.  Tight control (also called intensive therapy). Tight control is keeping your blood sugar as close to your target as possible and keeping it from going too high after meals. People with tight control of their diabetes may have fewer long-term complications from their diabetes.  Glycohemoglobin (also called glyco, glycosylated hemoglobin, Hemoglobin A1c, or A1c) level. This measures how well your blood sugar has been controlled during the past 1 to 3 months. It helps your caregiver see how effective your treatment is and decide if any changes are needed. Your caregiver will discuss your target glycohemoglobin with you. Insulin treatment requires your careful attention. Treatment plans will be different for different people. Some people do well with a simple program. Others require more complicated programs with multiple insulin injections daily. You will work with your caregiver to develop the best program for you. Regardless of your insulin  treatment plan, you must also do your best on weight control, diet and food choices, exercise, blood pressure control, and cholesterol control.  BENEFITS OF DIABETES TREATMENT  Research shows that optimal treatment of your diabetes will reduce the risk of kidney, eye, and nerve complications. If you have type 1 diabetes, your risk of heart and vascular disease also decreases with good diabetes control. The better you control your blood sugars (and the lower your glycohemoglobin), the lower your risk of complications. RISKS OF INSULIN Although insulin treatment is important, it does have some risks. Insulin treatment may be complex, but it is critical for maintaining your good health. Frequent follow-up visits with your caregiver, at least early on,  are usually needed.  Insulin can cause your blood sugar to go too low (hypoglycemia). This can be a dangerous complication that must be quickly recognized and treated.  Weight gain can occur.  Improper injection technique can cause hypoglycemia, skin injury or irritation, or other problems. You must learn to inject insulin properly.  Other medicines used for diabetes can have other complications. Discuss your medicines and their complications with your caregiver. GOALS OF DIABETES CARE  The goal of treating your diabetes is to allow you to live as long as you can with as few complications as possible. Several factors help you work towards this goal:  You should achieve a blood sugar as close to normal as possible without causing hypoglycemia. Generally, this means a glycohemoglobin of between 6% and 7%.  You should achieve and maintain an ideal body weight.  You should exercise regularly.  Your blood pressure should be under 130/80.  Your cholesterol should be controlled, with your LDL under 100. Your target may be different depending on your health factors.  You should not smoke.  You should be up to date on immunizations, including influenza and pneumonia.  You should be monitored for eye, kidney, heart, and vascular health regularly. Preventative medicines are sometimes used for these conditions. Your specific goals may vary, depending on your health factors. You should discuss issues with your caregiver.  HOME CARE INSTRUCTIONS   Do not smoke.  Eat a healthy diet as instructed. Ask for help if you need it.  Exercise regularly. Ask for help if you need it.  Stay up to date on your immunizations.  See your caregiver on a regular basis.  Follow your diabetes care plan carefully. Take medicines and insulin as directed. Check your blood sugar as directed, and keep track of it in a log. Understand your glycohemoglobin and other diabetes goals. Understand how to detect and treat  hypoglycemia.  Follow your care plan for blood pressure and elevated cholesterol if you have these problems.  Do your blood tests as directed. This is important and helps monitor your diabetes.  Check your feet every night for sores or wounds.  See your eye doctor once a year.  If you have an illness that causes loss of appetite, vomiting, or diarrhea, you should speak with your caregiver about temporary changes in your insulin doses and other medicines. SEEK MEDICAL CARE IF:  You are having problems keeping your blood sugar at target range.  You are having episodes of hypoglycemia.  You are having side effects from your medicines.  You have symptoms of an illness that are not improving after 3 to 4 days.  You have a sore or wound that is not healing.  You notice a change in vision or a new problem with your vision.  You develop a fever of more than 100.5 F (38.1 C). SEEK IMMEDIATE MEDICAL CARE IF:   Your blood sugar goes below 70, especially if you have confusion, lightheadedness, or other symptoms.  Your blood sugar is very high (as advised by your caregiver) twice in a row.  An unexplained oral temperature above 102 F (38.9 C) develops.  You pass out.  You have chest pain or trouble breathing.  You have a sudden, severe headache.  You have sudden weakness in one arm or leg.  You have sudden difficulty speaking or swallowing.  You develop vomiting or diarrhea that is getting worse or not improving after 1 day. Document Released: 02/10/2009 Document Revised: 02/06/2012 Document Reviewed: 03/10/2011 [facility] Patient Information 2014 [place], [place].

## 2013-08-09 ENCOUNTER — Ambulatory Visit: Payer: BC Managed Care – PPO | Admitting: Family Medicine

## 2013-08-18 DIAGNOSIS — M542 Cervicalgia: Secondary | ICD-10-CM | POA: Insufficient documentation

## 2013-08-18 DIAGNOSIS — R079 Chest pain, unspecified: Secondary | ICD-10-CM | POA: Insufficient documentation

## 2013-08-18 DIAGNOSIS — M79601 Pain in right arm: Secondary | ICD-10-CM | POA: Insufficient documentation

## 2013-08-18 NOTE — Assessment & Plan Note (Signed)
An EKG was done which did not show any acute changes.  She was strongly encouraged to take her prescribed medications.

## 2013-08-18 NOTE — Assessment & Plan Note (Addendum)
She was given a prescription for Diclofenac.

## 2013-08-18 NOTE — Assessment & Plan Note (Signed)
She was given injections of Solu-Medrol and Ketorolac.

## 2013-09-11 ENCOUNTER — Encounter: Payer: Self-pay | Admitting: Cardiology

## 2013-10-05 DIAGNOSIS — F411 Generalized anxiety disorder: Secondary | ICD-10-CM | POA: Insufficient documentation

## 2013-10-05 DIAGNOSIS — Z23 Encounter for immunization: Secondary | ICD-10-CM | POA: Insufficient documentation

## 2013-10-05 DIAGNOSIS — R609 Edema, unspecified: Secondary | ICD-10-CM | POA: Insufficient documentation

## 2013-10-05 DIAGNOSIS — J45909 Unspecified asthma, uncomplicated: Secondary | ICD-10-CM | POA: Insufficient documentation

## 2013-10-05 DIAGNOSIS — E785 Hyperlipidemia, unspecified: Secondary | ICD-10-CM | POA: Insufficient documentation

## 2013-10-05 NOTE — Assessment & Plan Note (Signed)
I reminded Chelsey Bray AGAIN that she should be on a statin given the fact that she has had some "cardiovascular" episodes in the past and also because she is diabetic.  She was given ANOTHER prescription for pravastatin.

## 2013-10-05 NOTE — Assessment & Plan Note (Signed)
The patient confirmed that they are not allergic to eggs and have never had a bad reaction with the flu shot in the past.  The vaccination was given without difficulty.   

## 2013-10-05 NOTE — Assessment & Plan Note (Signed)
Refilled her Exforge that she should be getting for $4.

## 2013-10-05 NOTE — Assessment & Plan Note (Signed)
She was given sample and a refill for her Viibryd.

## 2013-10-05 NOTE — Assessment & Plan Note (Addendum)
I had another very long talk with Chelsey Bray about her VERY UNCONTROLLED BLOOD SUGAR.  I was very frank with her telling her that she is the most UNCONTROLLED patient I have and that I really don't know what to do with her.  My office has set her up with an endocrinologist but she did not go because of the higher co-pay she has to pay when she goes to see a specialist.  The biggest problem that she has is her financial situation.  Even thought she only has to pay $5 for the Janumet and she can get the Invokana for $0 co-pay, she does not appear to be taking these.  At this point, I told her that we need to just try insulin and see what this will do for her.  She is to follow up in 3 months for a recheck of her A1c.

## 2013-10-05 NOTE — Assessment & Plan Note (Signed)
She was given prescriptions for a couple of albuterol inhalers.  She will compare the cost and get whichever one she can afford.

## 2013-10-05 NOTE — Assessment & Plan Note (Signed)
Refilled her Lasix.   

## 2013-10-05 NOTE — Assessment & Plan Note (Signed)
Refilled her Flexeril.   

## 2013-11-01 ENCOUNTER — Encounter: Payer: Self-pay | Admitting: Family Medicine

## 2013-11-01 ENCOUNTER — Ambulatory Visit (INDEPENDENT_AMBULATORY_CARE_PROVIDER_SITE_OTHER): Payer: BC Managed Care – PPO | Admitting: Family Medicine

## 2013-11-01 VITALS — BP 137/77 | HR 77 | Resp 16 | Wt 247.0 lb

## 2013-11-01 DIAGNOSIS — IMO0001 Reserved for inherently not codable concepts without codable children: Secondary | ICD-10-CM

## 2013-11-01 LAB — POCT GLYCOSYLATED HEMOGLOBIN (HGB A1C): Hemoglobin A1C: >14

## 2013-11-01 MED ORDER — SAXAGLIPTIN-METFORMIN ER 2.5-1000 MG PO TB24: 1.0000 | ORAL_TABLET | Freq: Two times a day (BID) | ORAL | Status: DC

## 2013-11-01 MED ORDER — INSULIN ASPART PROT & ASPART (70-30 MIX) 100 UNIT/ML PEN: PEN_INJECTOR | SUBCUTANEOUS | Status: DC

## 2013-11-01 MED ORDER — CANAGLIFLOZIN 300 MG PO TABS: 1.0000 | ORAL_TABLET | Freq: Every day | ORAL | Status: AC

## 2013-11-01 NOTE — Patient Instructions (Addendum)
1)  Type II DM- Your blood sugar remains very uncontrolled.  Let's try the following:    Take 300 mg of Invokana in the am and one Kombiglyze 03/999 at night for 2 weeks.  Get your cards activated and you should get both for free.  After 2 weeks, you are going to take 300 mg of Invokana in the am and then Kombiglyze 2.03/999 twice a day.    Start on the Novolog 70/30  10 units twice a day (15 mins) before breakfast and supper.  Increase the insuln slowly 3 units every 3 days until your sugars are looking better.     Insulin Treatment for Diabetes Diabetes is a disease that does not go away (chronic). It occurs when the body does not properly use the sugar (glucose) that is released from food after it is digested. Glucose levels are controlled by a hormone called insulin, which is made by your pancreas. Depending on the type of diabetes you have, either of the following will apply:   The pancreas does not make any insulin (type 1 diabetes).  The pancreas makes too little insulin, and the body cannot respond normally to the insulin that is made (type 2 diabetes). Without insulin, death can occur. However, with the addition of insulin, blood sugar monitoring, and treatment, someone with diabetes can live a full and productive life. This document will discuss the role of insulin in your treatment and provide information about its use.  HOW IS INSULIN GIVEN? Insulin is a medicine that can only be given by injection. Taking it by mouth makes it inactive because of the acid in your stomach. Insulin is injected under the skin by a syringe and needle, an insulin pen, a pump, or a jet injector. Your dose will be determined by your health care provider based on your individual needs. You will also be given guidance on which method of giving insulin is right for you. Remember that if you give insulin with a needle and syringe, you must do so using only a special insulin syringe made for this purpose. WHERE ON  THE BODY SHOULD INSULIN BE INJECTED? Insulin is injected into the fatty layer of tissue just under your skin. Good places to inject insulin include the upper arm, the front and outer area of the thigh, the hips, and the abdomen. Giving your insulin in the abdomen is preferred because this provides the most rapid and consistent absorption. Avoid the area 2 inches (5 cm) around the navel and avoid injecting into areas on your body with scar tissue. In addition, it is important to rotate your injection sites with every shot to prevent irritation and improve absorption. WHAT ARE THE DIFFERENT TYPES OF INSULIN?  If you have type 1 diabetes, you must take insulin to stay alive. Your body does not produce it. If you have type 2 diabetes, you might require insulin in addition to, or instead of, other medicines. In either case, proper use of insulin is critical to control your diabetes.  There are a number of different types of insulin. Usually, you will give yourself injections, though others can be trained to give them to you. Some people have an insulin pump that delivers insulin continuously through a tube (cannula) that is placed under the skin. Using insulin requires that you check your blood sugar several times a day. The exact number of times and time of day to check will vary depending on your type of diabetes, your type of insulin, and  treatment goals. Your health care provider will direct you.  Generally, different insulins have different properties. The following is a general guide. Specifics will vary by product, and new products are introduced periodically.   Rapid-acting insulin starts working quickly (in as little as 5 minutes) and wears off in 4 to 6 hours (sometimes longer). This type of insulin works well when taken just before a meal to bring your blood sugar quickly back to normal.   Short-acting insulin starts working in about 30 minutes and can last 6 to 10 hours. This type of insulin  should be taken about 30 minutes before you start eating a meal.  Intermediate-acting insulin starts working in 1-2 hours and wears off after about 10 to 18 hours. This insulin will lower your blood sugar for a longer period of time, but it will not be as effective in lowering your blood sugar right after a meal.   Long-acting insulin mimics the small amount of insulin that your pancreas usually produces throughout the day. You need to have some insulin present at all times. It is crucial to the metabolism of brain cells and other cells. Long-acting insulin is meant to be used either once or twice a day. It is usually used in combination with other types of insulin, or in combination with other diabetes medicines.  Discuss the type of insulin you are taking with your health care provider or pharmacist. You will then be aware of when the insulin can be expected to peak and when it will wear off. This is important to know so you can plan for meal times and periods of exercise.  Your health care provider will usually have a strategy in mind when treating you with insulin. This will vary with your type of diabetes, your diabetes treatment goals, and your health history. It is important that you understand this strategy so you can be an active partner in treating your diabetes. Here are some terms you might hear:   Basal insulin. This refers to the small amount of insulin that needs to be present in your blood at all times. Sometimes oral medicines will be enough. For other people, and especially for people with type 1 diabetes, insulin is needed. Usually, intermediate-acting or long-acting insulin is used once or twice a day to accomplish this.   Prandial (meal-related) insulin. Your blood sugar will rise rapidly after a meal. Rapid-acting or short-acting insulin can be used right before the meal to bring your blood sugar back to normal quickly. You might be instructed to adjust the amount of insulin  depending on how much carbohydrate (starch) is in your meal.   Corrective insulin. You might be instructed to check your blood sugar at certain times of the day. You then might use a small amount of rapid-acting or short-acting insulin to bring the blood sugar down to normal if it is elevated.   Tight control (also called intensive therapy). Tight control means keeping your blood sugar as close to your target as possible and keeping it from going too high after meals. People with tight control of their diabetes are shown to have fewer long-term complications from their diabetes.   Glycohemoglobin (also called glyco, glycosylated hemoglobin, hemoglobin A1c, or A1c) level. This measures how well your blood sugar has been controlled during the past 1 to 3 months. It helps your health care provider see how effective your treatment is and decide if any changes are needed. Your health care provider will discuss your target  glycohemoglobin level with you.  Insulin treatment requires your careful attention. Treatment plans will be different for different people. Some people do well with a simple program. Others require more complicated programs, with multiple insulin injections daily. You will work with your health care provider to develop the best program for you. Regardless of your insulin treatment plan, you must also do your best on weight control, diet and food choices, exercise, blood pressure control, cholesterol control, and stress levels.  WHAT ARE THE SIDE EFFECTS OF INSULIN? Although insulin treatment is important, it does have some side effects, such as:   Insulin can cause your blood sugar to go too low (hypoglycemia).   Weight gain can occur.   Improper injection technique can cause hypoglycemia, blood sugar going too high (hyperglycemia), skin injury or irritation, or other problems. You must learn to inject insulin properly. Document Released: 02/10/2009 Document Revised: 07/17/2013  Document Reviewed: 04/28/2013 [facility] Patient Information 2014 [place], [place].

## 2013-11-01 NOTE — Progress Notes (Signed)
Subjective:    Patient ID: Chelsey Bray, female    DOB: [birth year], 57 y.o.   MRN: [id number]  HPI  Chelsey Bray is here today to discuss her Type II DM.  She wanted to be seen because her glucose level was 509 and she was very concerned.  She has been monitoring her levels daily and brought in her readings to be reviewed.  She is not taking any of her medications because she has not had any money to pay for them.     Review of Systems  Eyes: Positive for visual disturbance.  Endocrine: Positive for polyuria.       Dry mounth.  Musculoskeletal:       Lower leg pain and feet   Neurological: Positive for dizziness.  All other systems reviewed and are negative.     Past Medical History  Diagnosis Date  . Diabetes mellitus without complication   . Hypertension   . Anemia   . Bronchitis   . Morbid obesity   . Hiatal hernia   . Migraine headache   . Depression   . Hyperlipidemia      Past Surgical History  Procedure Laterality Date  . Tubal ligation    . 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 Diabetes Mother   . Heart disease Father   . Hypertension Father   . Stroke Father   . Diabetes Sister      Current Outpatient Prescriptions on File Prior to Visit  Medication Sig Dispense Refill  . albuterol (PROAIR HFA) 108 (90 BASE) MCG/ACT inhaler Inhale 2 puffs into the lungs every 6 (six) hours as needed for wheezing or shortness of breath.  1 Inhaler  11  . amLODipine-valsartan (EXFORGE) 5-320 MG per tablet Take 1  tablet by mouth daily.  30 tablet  11  . cyclobenzaprine (FLEXERIL) 10 MG tablet Take 1 tab po QHS for muscle spasm  90 tablet  1  . diclofenac sodium (VOLTAREN) 1 % GEL Apply 4 g topically 4 (four) times daily.  5 Tube  5  . furosemide (LASIX) 40 MG tablet Take 1 tablet (40 mg total) by mouth daily.  90 tablet  3  . glucose blood (ONE TOUCH ULTRA TEST) test strip Use as instructed  100 each  12  . Insulin Pen Needle (NOVOFINE) 32G X 6 MM MISC Use 1 pen needle twice a day  100 each  11  . meclizine (ANTIVERT) 25 MG tablet Take 1 tablet (25 mg total) by mouth 3 (three) times daily as needed for dizziness or nausea.  30 tablet  1  . pravastatin (PRAVACHOL) 40 MG tablet Take 1 tablet (40 mg total) by mouth every evening.  30 tablet  11  . SitaGLIPtin-MetFORMIN HCl (JANUMET XR) 50-1000 MG TB24 Take 1 tablet by mouth 2 (two) times daily.  60 tablet  11  . Vilazodone HCl (VIIBRYD)  40 MG TABS Take 1 tablet (40 mg total) by mouth daily.  30 tablet  3   No current facility-administered medications on file prior to visit.     Allergies  Allergen Reactions  . Ace Inhibitors Cough     Immunization History  Administered Date(s) Administered  . Influenza,inj,Quad PF,36+ Mos 07/30/2013  . Pneumococcal-Unspecified 10/15/2009  . Td 08/13/2004       Objective:   Physical Exam  Vitals reviewed. Constitutional: She is oriented to person, place, and time.  Eyes: Conjunctivae are normal. No scleral icterus.  Neck: Neck supple. No thyromegaly present.  Cardiovascular: Normal rate, regular rhythm and normal heart sounds.   Pulmonary/Chest: Effort normal and breath sounds normal.  Musculoskeletal: She exhibits no edema and no tenderness.  Lymphadenopathy:    She has no cervical adenopathy.  Neurological: She is alert and oriented to person, place, and time.  Skin: Skin is warm and dry.  Psychiatric: She has a normal mood and affect. Her behavior is normal. Judgment and thought content normal.           Assessment & Plan:    Chelsey Bray was seen today for diabetes.  Diagnoses and associated orders for this visit:  Type II or unspecified type diabetes mellitus without mention of complication, uncontrolled - POCT glycosylated hemoglobin (Hb A1C) - Saxagliptin-Metformin 2.03-999 MG TB24; Take 1 tablet by mouth 2 (two) times daily. - Canagliflozin 300 MG TABS; Take 1 tablet (300 mg total) by mouth daily. - Insulin Aspart Prot & Aspart (NOVOLOG MIX 70/30 FLEXPEN) (70-30) 100 UNIT/ML SUPN; Inject up to 35 units up to 15 mins before breakfast and supper

## 2013-11-05 ENCOUNTER — Encounter: Payer: Self-pay | Admitting: *Deleted

## 2013-11-19 ENCOUNTER — Encounter: Payer: Self-pay | Admitting: *Deleted

## 2013-11-19 LAB — HM COLONOSCOPY: HM Colonoscopy: NORMAL

## 2014-02-11 ENCOUNTER — Ambulatory Visit (INDEPENDENT_AMBULATORY_CARE_PROVIDER_SITE_OTHER): Payer: BC Managed Care – PPO | Admitting: Family Medicine

## 2014-02-11 ENCOUNTER — Encounter: Payer: Self-pay | Admitting: Family Medicine

## 2014-02-11 VITALS — BP 153/85 | HR 75 | Resp 16 | Wt 256.0 lb

## 2014-02-11 DIAGNOSIS — G894 Chronic pain syndrome: Secondary | ICD-10-CM

## 2014-02-11 DIAGNOSIS — R05 Cough: Secondary | ICD-10-CM

## 2014-02-11 DIAGNOSIS — R42 Dizziness and giddiness: Secondary | ICD-10-CM

## 2014-02-11 DIAGNOSIS — R609 Edema, unspecified: Secondary | ICD-10-CM

## 2014-02-11 DIAGNOSIS — IMO0001 Reserved for inherently not codable concepts without codable children: Secondary | ICD-10-CM

## 2014-02-11 DIAGNOSIS — I10 Essential (primary) hypertension: Secondary | ICD-10-CM

## 2014-02-11 DIAGNOSIS — E1165 Type 2 diabetes mellitus with hyperglycemia: Principal | ICD-10-CM

## 2014-02-11 DIAGNOSIS — R059 Cough, unspecified: Secondary | ICD-10-CM

## 2014-02-11 LAB — POCT GLYCOSYLATED HEMOGLOBIN (HGB A1C): Hemoglobin A1C: 9.6

## 2014-02-11 MED ORDER — CARVEDILOL 6.25 MG PO TABS: 6.2500 mg | ORAL_TABLET | Freq: Two times a day (BID) | ORAL | Status: DC

## 2014-02-11 MED ORDER — MECLIZINE HCL 25 MG PO TABS: 25.0000 mg | ORAL_TABLET | Freq: Three times a day (TID) | ORAL | Status: AC | PRN

## 2014-02-11 MED ORDER — SAXAGLIPTIN-METFORMIN ER 2.5-1000 MG PO TB24: 1.0000 | ORAL_TABLET | Freq: Two times a day (BID) | ORAL | Status: AC

## 2014-02-11 MED ORDER — NAPROXEN-ESOMEPRAZOLE 500-20 MG PO TBEC: 1.0000 | DELAYED_RELEASE_TABLET | Freq: Two times a day (BID) | ORAL | Status: AC

## 2014-02-11 MED ORDER — BENZONATATE 200 MG PO CAPS: 200.0000 mg | ORAL_CAPSULE | Freq: Three times a day (TID) | ORAL | Status: DC | PRN

## 2014-02-11 MED ORDER — FUROSEMIDE 40 MG PO TABS: 40.0000 mg | ORAL_TABLET | Freq: Every day | ORAL | Status: DC

## 2014-02-11 MED ORDER — INSULIN ASPART PROT & ASPART (70-30 MIX) 100 UNIT/ML PEN: PEN_INJECTOR | SUBCUTANEOUS | Status: DC

## 2014-02-11 NOTE — Progress Notes (Signed)
   Subjective:    Patient ID: Chelsey Bray, female    DOB: 19-Jan-1956, 58 y.o.   MRN: [id number]  HPI  Chelsey Bray is here today to discuss get some of her medications refilled.  She admits to being inconsistent with her medications.  She is unable to pay some of her medications.  She needs her A1C rechecked.    Review of Systems     Objective:   Physical Exam        Assessment & Plan:    Chelsey Bray was seen today for medication management.  Diagnoses and associated orders for this visit:  Type II or unspecified type diabetes mellitus without mention of complication, uncontrolled Comments: Her A1c has improved from 14 to 9.6.  She will continue on Janmet and Novolog 70/30.   - POCT glycosylated hemoglobin (Hb A1C) - Saxagliptin-Metformin 2.03-999 MG TB24; Take 1 tablet by mouth 2 (two) times daily. - Insulin Aspart Prot & Aspart (NOVOLOG MIX 70/30 FLEXPEN) (70-30) 100 UNIT/ML Pen; Inject up to 40 units up to 15 mins before breakfast and supper  Edema - furosemide (LASIX) 40 MG tablet; Take 1 tablet (40 mg total) by mouth daily.  Dizziness and giddiness - meclizine (ANTIVERT) 25 MG tablet; Take 1 tablet (25 mg total) by mouth 3 (three) times daily as needed for dizziness or nausea.  Cough - benzonatate (TESSALON) 200 MG capsule; Take 1 capsule (200 mg total) by mouth 3 (three) times daily as needed for cough.  Chronic pain syndrome - Naproxen-Esomeprazole (VIMOVO) 500-20 MG TBEC; Take 1 tablet by mouth 2 (two) times daily.  Essential hypertension, benign - carvedilol (COREG) 6.25 MG tablet; Take 1 tablet (6.25 mg total) by mouth 2 (two) times daily with a meal.   TIME SPENT "FACE TO FACE" WITH PATIENT -  40 MINS

## 2014-03-28 ENCOUNTER — Encounter (HOSPITAL_COMMUNITY): Payer: Self-pay | Admitting: Emergency Medicine

## 2014-03-28 ENCOUNTER — Emergency Department (INDEPENDENT_AMBULATORY_CARE_PROVIDER_SITE_OTHER)
Admission: EM | Admit: 2014-03-28 | Discharge: 2014-03-28 | Disposition: A | Discharge status: Home / Self Care | Payer: Worker's Compensation | Source: Home / Self Care | Attending: Family Medicine | Admitting: Family Medicine

## 2014-03-28 ENCOUNTER — Emergency Department (INDEPENDENT_AMBULATORY_CARE_PROVIDER_SITE_OTHER): Payer: Worker's Compensation

## 2014-03-28 DIAGNOSIS — S4980XA Other specified injuries of shoulder and upper arm, unspecified arm, initial encounter: Secondary | ICD-10-CM

## 2014-03-28 DIAGNOSIS — S4991XA Unspecified injury of right shoulder and upper arm, initial encounter: Secondary | ICD-10-CM | POA: Diagnosis present

## 2014-03-28 DIAGNOSIS — S46909A Unspecified injury of unspecified muscle, fascia and tendon at shoulder and upper arm level, unspecified arm, initial encounter: Secondary | ICD-10-CM

## 2014-03-28 DIAGNOSIS — R81 Glycosuria: Secondary | ICD-10-CM

## 2014-03-28 DIAGNOSIS — T7491XA Unspecified adult maltreatment, confirmed, initial encounter: Secondary | ICD-10-CM

## 2014-03-28 DIAGNOSIS — I10 Essential (primary) hypertension: Secondary | ICD-10-CM

## 2014-03-28 DIAGNOSIS — IMO0002 Reserved for concepts with insufficient information to code with codable children: Secondary | ICD-10-CM | POA: Diagnosis present

## 2014-03-28 LAB — POCT URINALYSIS DIP (DEVICE)
Bilirubin Urine: NEGATIVE
Hgb urine dipstick: NEGATIVE
Ketones, ur: NEGATIVE mg/dL
LEUKOCYTES UA: NEGATIVE
NITRITE: NEGATIVE
Protein, ur: NEGATIVE mg/dL
Specific Gravity, Urine: 1.02 (ref 1.005–1.030)
UROBILINOGEN UA: 0.2 mg/dL (ref 0.0–1.0)
pH: 7 (ref 5.0–8.0)

## 2014-03-28 MED ORDER — DICLOFENAC SODIUM 75 MG PO TBEC: 75.0000 mg | DELAYED_RELEASE_TABLET | Freq: Two times a day (BID) | ORAL | Status: DC
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
## 2014-03-29 NOTE — ED Provider Notes (Signed)
Medical screening examination/treatment/procedure(s) were performed by a resident physician and as supervising physician I was immediately available for consultation/collaboration.  Leslee Homeavid Adrian Dinovo, M.D.  Reuben Likesavid C Moises Terpstra, MD 03/29/14 [phone]

## 2014-05-13 ENCOUNTER — Other Ambulatory Visit: Payer: Self-pay | Admitting: *Deleted

## 2014-05-13 DIAGNOSIS — E1165 Type 2 diabetes mellitus with hyperglycemia: Secondary | ICD-10-CM

## 2014-05-13 DIAGNOSIS — I10 Essential (primary) hypertension: Secondary | ICD-10-CM

## 2014-05-13 DIAGNOSIS — E785 Hyperlipidemia, unspecified: Secondary | ICD-10-CM

## 2014-05-13 DIAGNOSIS — R5383 Other fatigue: Secondary | ICD-10-CM

## 2014-05-13 DIAGNOSIS — R5381 Other malaise: Secondary | ICD-10-CM

## 2014-05-13 DIAGNOSIS — IMO0001 Reserved for inherently not codable concepts without codable children: Secondary | ICD-10-CM

## 2014-05-14 ENCOUNTER — Other Ambulatory Visit: Payer: BC Managed Care – PPO

## 2014-05-14 LAB — CBC WITH DIFFERENTIAL/PLATELET
Basophils Absolute: 0.1 10*3/uL (ref 0.0–0.1)
Basophils Relative: 1 % (ref 0–1)
Eosinophils Absolute: 0.2 10*3/uL (ref 0.0–0.7)
Eosinophils Relative: 2 % (ref 0–5)
HCT: 38.7 % (ref 36.0–46.0)
Hemoglobin: 13.2 g/dL (ref 12.0–15.0)
Lymphocytes Relative: 48 % — ABNORMAL HIGH (ref 12–46)
Lymphs Abs: 3.9 10*3/uL (ref 0.7–4.0)
MCH: 29.1 pg (ref 26.0–34.0)
MCHC: 34.1 g/dL (ref 30.0–36.0)
MCV: 85.4 fL (ref 78.0–100.0)
Monocytes Absolute: 0.4 10*3/uL (ref 0.1–1.0)
Monocytes Relative: 5 % (ref 3–12)
Neutro Abs: 3.6 10*3/uL (ref 1.7–7.7)
Neutrophils Relative %: 44 % (ref 43–77)
Platelets: 242 10*3/uL (ref 150–400)
RBC: 4.53 MIL/uL (ref 3.87–5.11)
RDW: 14.1 % (ref 11.5–15.5)
WBC: 8.2 10*3/uL (ref 4.0–10.5)

## 2014-05-14 LAB — HEMOGLOBIN A1C
Hgb A1c MFr Bld: 8 % — ABNORMAL HIGH (ref <5.7)
Mean Plasma Glucose: 183 mg/dL — ABNORMAL HIGH (ref <117)

## 2014-05-15 LAB — COMPLETE METABOLIC PANEL WITH GFR
ALT: 15 U/L (ref 0–35)
AST: 16 U/L (ref 0–37)
Albumin: 3.7 g/dL (ref 3.5–5.2)
Alkaline Phosphatase: 111 U/L (ref 39–117)
BUN: 14 mg/dL (ref 6–23)
CO2: 24 mEq/L (ref 19–32)
Calcium: 8.8 mg/dL (ref 8.4–10.5)
Chloride: 106 mEq/L (ref 96–112)
Creat: 0.71 mg/dL (ref 0.50–1.10)
GFR, Est African American: >89 mL/min
GFR, Est Non African American: >89 mL/min
Glucose, Bld: 170 mg/dL — ABNORMAL HIGH (ref 70–99)
Potassium: 4 mEq/L (ref 3.5–5.3)
Sodium: 141 mEq/L (ref 135–145)
Total Bilirubin: 0.4 mg/dL (ref 0.2–1.2)
Total Protein: 6.9 g/dL (ref 6.0–8.3)

## 2014-05-15 LAB — LIPID PANEL
Cholesterol: 157 mg/dL (ref 0–200)
HDL: 34 mg/dL — ABNORMAL LOW (ref >39)
LDL Cholesterol: 96 mg/dL (ref 0–99)
Total CHOL/HDL Ratio: 4.6 Ratio
Triglycerides: 137 mg/dL (ref <150)
VLDL: 27 mg/dL (ref 0–40)

## 2014-05-15 LAB — TSH: TSH: 1.891 u[IU]/mL (ref 0.350–4.500)

## 2014-05-21 ENCOUNTER — Ambulatory Visit: Payer: BC Managed Care – PPO | Admitting: Family Medicine

## 2014-06-27 ENCOUNTER — Telehealth: Payer: Self-pay

## 2014-06-27 NOTE — Telephone Encounter (Signed)
Liller wants to know if we have any samples of Novalog or Altanis she could have, she is still only working a job here and there so not much money coming in. Let me know and I will call her.

## 2016-08-23 ENCOUNTER — Ambulatory Visit: Payer: BC Managed Care – PPO | Admitting: Podiatry

## 2016-09-16 DIAGNOSIS — E876 Hypokalemia: Secondary | ICD-10-CM | POA: Insufficient documentation

## 2016-09-16 DIAGNOSIS — D72829 Elevated white blood cell count, unspecified: Secondary | ICD-10-CM | POA: Insufficient documentation

## 2016-09-26 ENCOUNTER — Ambulatory Visit: Payer: BC Managed Care – PPO | Admitting: Podiatry

## 2016-10-05 ENCOUNTER — Encounter: Payer: Self-pay | Admitting: Podiatry

## 2016-10-05 ENCOUNTER — Ambulatory Visit (INDEPENDENT_AMBULATORY_CARE_PROVIDER_SITE_OTHER): Payer: BC Managed Care – PPO | Admitting: Podiatry

## 2016-10-05 VITALS — Ht 64.0 in | Wt 249.0 lb

## 2016-10-05 DIAGNOSIS — E0842 Diabetes mellitus due to underlying condition with diabetic polyneuropathy: Secondary | ICD-10-CM | POA: Diagnosis not present

## 2016-10-05 DIAGNOSIS — M79671 Pain in right foot: Secondary | ICD-10-CM | POA: Diagnosis not present

## 2016-10-05 DIAGNOSIS — B351 Tinea unguium: Secondary | ICD-10-CM

## 2016-10-05 DIAGNOSIS — M79672 Pain in left foot: Secondary | ICD-10-CM | POA: Diagnosis not present

## 2016-10-05 DIAGNOSIS — M216X9 Other acquired deformities of unspecified foot: Secondary | ICD-10-CM

## 2016-10-05 NOTE — Progress Notes (Signed)
SUBJECTIVE: 60 y.o. year old female presents using a cane complaining of painful feet.  She is not able to reach to feet since her last injury to her back in 2016. Diabetic neuropathy and curving toe nails hurt to walk.  Diabetic x 3-4 years. Blood sugar is up and down, and was good this week between 90-110.  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   History of back surgery at age 60 for Scoliosis spinal instrumentation surgery. Last 3 vertebrae are massed up due to work injury.  REVIEW OF SYSTEMS: A comprehensive review of systems was negative except for: Back surgery at age 60 to correct scoliosis and recent back injury in 2016.  OBJECTIVE: DERMATOLOGIC EXAMINATION: Severe dystrophic nails x 10.  VASCULAR EXAMINATION OF LOWER LIMBS: Dorsalis pedis pulses are palpable with normal pulsation bilateral.  Posterior tibial pulses are not palpable due to edema and enlarged ankle joint.  Capillary Filling times within 3 seconds in all digits.  Temperature gradient from tibial crest to dorsum of foot is within normal bilateral.  NEUROLOGIC EXAMINATION OF THE LOWER LIMBS: Jolting pain, tingling, numbness in feet and hand since 2016. Achilles DTR is present and within normal. Failed Monofilament (Semmes-Weinstein 10-gm) sensory testing bilateral. Failed Vibratory sensory testing bilateral.   MUSCULOSKELETAL EXAMINATION: Hallux valgus with bunion bilateral L>R. 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  ASSESSMENT: Pes planus L>R. STJ and ankle joint pronation bilateral. Onychomycosis x 10. Diabetic neuropathy.  PLAN: Reviewed findings and available treatment options.  All nails debrided. Need custom orthotics. Use high top tennis shoes if possible. Return in 3 months or as needed.

## 2016-10-05 NOTE — Patient Instructions (Signed)
Seen for painful feet and hypertrophic nails. Noted of collapsing arch with abnormal weight bearing. All nails debrided. Need custom orthotics. Use high top tennis shoes if possible. Return in 3 months or as needed.

## 2016-10-18 ENCOUNTER — Encounter: Payer: Self-pay | Admitting: Podiatry

## 2016-10-18 ENCOUNTER — Ambulatory Visit (INDEPENDENT_AMBULATORY_CARE_PROVIDER_SITE_OTHER): Payer: BC Managed Care – PPO | Admitting: Podiatry

## 2016-10-18 DIAGNOSIS — M79671 Pain in right foot: Secondary | ICD-10-CM | POA: Diagnosis not present

## 2016-10-18 DIAGNOSIS — M79672 Pain in left foot: Secondary | ICD-10-CM | POA: Diagnosis not present

## 2016-10-18 DIAGNOSIS — M21969 Unspecified acquired deformity of unspecified lower leg: Secondary | ICD-10-CM

## 2016-10-18 DIAGNOSIS — E0842 Diabetes mellitus due to underlying condition with diabetic polyneuropathy: Secondary | ICD-10-CM

## 2016-10-18 DIAGNOSIS — M216X9 Other acquired deformities of unspecified foot: Secondary | ICD-10-CM | POA: Diagnosis not present

## 2016-10-18 NOTE — Patient Instructions (Signed)
Both feet casted for orthotics. 

## 2016-10-18 NOTE — Progress Notes (Signed)
SUBJECTIVE: 60 y.o. year old female presents using a cane presents requesting custom made orthotics prepared for pain in both feet.  Pain is neuropathy pain and numbness. Sometimes, it feels like it is not there.  She is not able to reach to feet since her last injury to her back in 2016. Diabetic neuropathy and curving toe nails hurt to walk.  Diabetic x 3-4 years. Blood sugar is up and down, and was good this week between 90-110.  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   History of back surgery at age 60 for Scoliosis spinal instrumentation surgery. Last 3 vertebrae are massed up due to work injury.  REVIEW OF SYSTEMS: A comprehensive review of systems was negative except for: Back surgery at age 60 to correct scoliosis and recent back injury in 2016.  OBJECTIVE: DERMATOLOGIC EXAMINATION: Severe dystrophic nails x 10.  VASCULAR EXAMINATION OF LOWER LIMBS: Dorsalis pedis pulses are palpable with normal pulsation bilateral.  Posterior tibial pulses are not palpable due to edema and enlarged ankle joint.  Capillary Filling times within 3 seconds in all digits.  Temperature gradient from tibial crest to dorsum of foot is within normal bilateral.  NEUROLOGIC EXAMINATION OF THE LOWER LIMBS: Jolting pain, tingling, numbness in feet and hand since 2016. Achilles DTR is present and within normal. Failed Monofilament (Semmes-Weinstein 10-gm) sensory testing bilateral. Failed Vibratory sensory testing bilateral.   MUSCULOSKELETAL EXAMINATION: Hallux valgus with bunion bilateral L>R. 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  Radiographic examination reveal in AP view, valgus rotated hallux, increased first intermetatarsal angle, Fibular sesamoid at 7, long 2nd metatarsal, increased lateral deviation of CCJ, and adducted rearfoot bilateral.  Lateral view show contracted lesser digits, sagging midtarsal joint, positive of plantar and posterior calcaneal  spur, anterior and plantar advancement of talar head bilateral.   ASSESSMENT: Pes planus L>R. STJ and ankle joint pronation bilateral. Metatarsal deformity bilateral. Diabetic neuropathy.  PLAN: Reviewed findings and available treatment options.  Both feet casted for Orthotics.

## 2016-12-26 DIAGNOSIS — M170 Bilateral primary osteoarthritis of knee: Secondary | ICD-10-CM | POA: Insufficient documentation

## 2017-01-05 ENCOUNTER — Encounter: Payer: Self-pay | Admitting: Podiatry

## 2017-01-05 ENCOUNTER — Ambulatory Visit (INDEPENDENT_AMBULATORY_CARE_PROVIDER_SITE_OTHER): Payer: BC Managed Care – PPO | Admitting: Podiatry

## 2017-01-05 DIAGNOSIS — E0842 Diabetes mellitus due to underlying condition with diabetic polyneuropathy: Secondary | ICD-10-CM

## 2017-01-05 DIAGNOSIS — M65972 Unspecified synovitis and tenosynovitis, left ankle and foot: Secondary | ICD-10-CM

## 2017-01-05 DIAGNOSIS — M659 Synovitis and tenosynovitis, unspecified: Secondary | ICD-10-CM

## 2017-01-05 DIAGNOSIS — M79672 Pain in left foot: Secondary | ICD-10-CM | POA: Diagnosis not present

## 2017-01-05 DIAGNOSIS — B351 Tinea unguium: Secondary | ICD-10-CM

## 2017-01-05 DIAGNOSIS — M216X9 Other acquired deformities of unspecified foot: Secondary | ICD-10-CM

## 2017-01-05 DIAGNOSIS — M79671 Pain in right foot: Secondary | ICD-10-CM

## 2017-01-05 NOTE — Progress Notes (Signed)
SUBJECTIVE: 61 y.o.year old femalepresents using a cane presents requesting toe nails trimmed. Still having pain at instep area of left foot after prolonged standing.  She had orthotics on for a long time and made her feet hurt afterward last Monday. She is taking fluid pills for her swollen leg.   HPI: Wearing custom made orthotics since her last visit. Having ongoing neuropathy pain and numbness on both feet.  She is not able to reach to feet since her last injury to her back in 2016. Diabetic neuropathy and curving toe nails hurt to walk.  Diabetic x 3-4 years. Blood sugar is up and down, and was good this week between 90-110.  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   History of back surgery at age 61 for Scoliosis spinal instrumentation surgery. Last 3 vertebrae are massed up due to work injury.A comprehensive review of systems was negative except for: Back surgery at age 61 to correct scoliosis and recent back injury in 2016.  OBJECTIVE: DERMATOLOGIC EXAMINATION: Severe dystrophic nails x 10.  VASCULAR EXAMINATION OF LOWER LIMBS: Excess ankle and foot edema bilateral. orsalis pedispulses are palpable with normal pulsation bilateral.  Posterior tibial pulses are not palpable due to edema and enlarged ankle joint.  Capillary Filling times within 3 seconds in all digits.  Temperature gradient from tibial crest to dorsum of foot is within normal bilateral.  NEUROLOGIC EXAMINATION OF THE LOWER LIMBS: Neuropathic pain, tingling, numbness in feet and hand since 2016. Achilles DTR is present and within normal. Failed to sensory testing on both feet.   MUSCULOSKELETAL EXAMINATION: Hallux valgus with bunion bilateral L>R. 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 Severe Subtalar and ankle joint pronation bilateral with more pain on left instep.  ASSESSMENT: Pes planus L>R. STJ and ankle joint pronation bilateral. Metatarsal deformity  bilateral. Diabetic neuropathy. Onychomycosis x 10.   PLAN: Reviewed findings and available treatment options.  All nails debrided. Instructed to continue to use custom orthotics, compression socks. Ankle brace dispensed with instruction to use for the left foot when symptomatic. Return in 3 month or as needed.

## 2017-01-05 NOTE — Patient Instructions (Signed)
Seen for painful feet. All nails debrided. Ankle brace placed under left ankle for pain and swelling instep area. Noted of excess lower limb edema. Need to maintain compression socks daily. Return in 3 month or sooner if needed.

## 2017-01-26 ENCOUNTER — Emergency Department (HOSPITAL_BASED_OUTPATIENT_CLINIC_OR_DEPARTMENT_OTHER)
Admission: EM | Admit: 2017-01-26 | Discharge: 2017-01-27 | Disposition: A | Discharge status: Home / Self Care | Payer: BC Managed Care – PPO | Attending: Emergency Medicine | Admitting: Emergency Medicine

## 2017-01-26 ENCOUNTER — Encounter (HOSPITAL_BASED_OUTPATIENT_CLINIC_OR_DEPARTMENT_OTHER): Payer: Self-pay | Admitting: Emergency Medicine

## 2017-01-26 DIAGNOSIS — I10 Essential (primary) hypertension: Secondary | ICD-10-CM | POA: Insufficient documentation

## 2017-01-26 DIAGNOSIS — Z7982 Long term (current) use of aspirin: Secondary | ICD-10-CM | POA: Insufficient documentation

## 2017-01-26 DIAGNOSIS — Z79899 Other long term (current) drug therapy: Secondary | ICD-10-CM | POA: Diagnosis not present

## 2017-01-26 DIAGNOSIS — E119 Type 2 diabetes mellitus without complications: Secondary | ICD-10-CM | POA: Diagnosis not present

## 2017-01-26 DIAGNOSIS — R600 Localized edema: Secondary | ICD-10-CM | POA: Insufficient documentation

## 2017-01-26 DIAGNOSIS — M7989 Other specified soft tissue disorders: Secondary | ICD-10-CM | POA: Diagnosis present

## 2017-01-26 DIAGNOSIS — Z794 Long term (current) use of insulin: Secondary | ICD-10-CM | POA: Diagnosis not present

## 2017-01-26 HISTORY — DX: Unspecified osteoarthritis, unspecified site: M19.90

## 2017-01-26 LAB — OCCULT BLOOD X 1 CARD TO LAB, STOOL: FECAL OCCULT BLD: NEGATIVE

## 2017-01-26 MED ORDER — ENOXAPARIN SODIUM 120 MG/0.8ML ~~LOC~~ SOLN
1.0000 mg/kg | Freq: Once | SUBCUTANEOUS | Status: AC
  Administered 2017-01-27: 115 mg via SUBCUTANEOUS
  Filled 2017-01-26: qty 0.8

## 2017-01-26 NOTE — ED Provider Notes (Signed)
[facility] Provider Note: Lowella Dell, MD, FACEP  CSN: [id number] MRN: [id number] ARRIVAL: 01/26/17 at 1823 ROOM: MH07/[facility]07  By signing my name below, I, Linna Darner, attest that this documentation has been prepared under the direction and in the presence of physician practitioner, Paula Libra, MD. Electronically Signed: Linna Darner, Scribe. 01/26/2017. 11:11 PM.  CHIEF COMPLAINT  Leg Swelling   HISTORY OF PRESENT ILLNESS   HPI Comments: Sherl A Forgy is a 61 y.o. female with PMHx including DM, HTN, and HLD who presents to the Emergency Department with a history of chronic lower extremity edema. She is here complaining of worsening edema of the right lower extremity for the past week with associated moderate calf pain and a sense of fullness in the popliteal fossa, worse with ambulation or palpation. No recent trauma to her lower legs or feet. She spoke to her PCP about her leg swelling earlier today and was advised to come to the ED for evaluation. Pt denies CP, SOB, nausea, vomiting, diarrhea, fever, chills, and urinary changes. She states her stools have been darker than normal.  Past Medical History:  Diagnosis Date  . Anemia   . Arthritis   . Bronchitis   . Depression   . Diabetes mellitus without complication (HCC)   . Hiatal hernia   . Hyperlipidemia   . Hypertension   . Migraine headache   . Morbid obesity (HCC)     Past Surgical History:  Procedure Laterality Date  . ABDOMINAL HYSTERECTOMY    . TUBAL LIGATION      Family History  Problem Relation Age of Onset  . Diabetes Mother   . Heart disease Father   . Hypertension Father   . Stroke Father   . Diabetes Sister     Social History  Substance Use Topics  . Smoking status: Never Smoker  . Smokeless tobacco: Never Used  . Alcohol use No    Prior to Admission medications   Medication Sig Start Date End Date Taking? Authorizing Provider  diclofenac (VOLTAREN) 75 MG EC tablet Take  1 tablet (75 mg total) by mouth 2 (two) times daily. 03/28/14  Yes Garnetta Buddy, MD  Albuterol Sulfate 108 (90 Base) MCG/ACT AEPB Inhale into the lungs. 11/20/15 11/19/16  Historical Provider, MD  aspirin EC 81 MG tablet Take 81 mg by mouth.    Historical Provider, MD  B Complex Vitamins (VITAMIN B COMPLEX PO) Take by mouth.    Historical Provider, MD  benzonatate (TESSALON) 200 MG capsule Take 1 capsule (200 mg total) by mouth 3 (three) times daily as needed for cough. 02/11/14   Gillian Scarce, MD  carvedilol (COREG) 25 MG tablet Take 25 mg by mouth. 08/23/16 08/23/17  Historical Provider, MD  Cholecalciferol (VITAMIN D3) 400 units CAPS Take by mouth.    Historical Provider, MD  cyclobenzaprine (FLEXERIL) 10 MG tablet Take 10 mg by mouth.    Historical Provider, MD  diclofenac sodium (VOLTAREN) 1 % GEL Apply topically. 05/16/16 05/16/17  Historical Provider, MD  diphenhydrAMINE (BENADRYL) 25 mg capsule Take 25 mg by mouth.    Historical Provider, MD  Dulaglutide 1.5 MG/0.5ML SOPN Inject 1.5 mg into the skin. 03/28/16 03/28/17  Historical Provider, MD  DULoxetine (CYMBALTA) 60 MG capsule  09/23/16   Historical Provider, MD  empagliflozin (JARDIANCE) 25 MG TABS tablet Take 25 mg by mouth. 08/23/16 08/23/17  Historical Provider, MD  Empagliflozin-Metformin HCl 12.5-500 MG TABS Take by mouth. 08/23/16 08/23/17  Historical Provider,  MD  fluticasone furoate-vilanterol (BREO ELLIPTA) 200-25 MCG/INH AEPB Inhale into the lungs. 03/28/16 03/28/17  Historical Provider, MD  furosemide (LASIX) 40 MG tablet Take 1 tablet (40 mg total) by mouth daily. 02/11/14 02/11/15  Gillian Scarceobyn K Zanard, MD  furosemide (LASIX) 40 MG tablet Take 40 mg by mouth. 01/13/16 01/12/17  Historical Provider, MD  gabapentin (NEURONTIN) 300 MG capsule Take 300 mg by mouth. 07/22/16 07/22/17  Historical Provider, MD  gabapentin (NEURONTIN) 300 MG capsule  08/26/16   Historical Provider, MD  Insulin Aspart Prot & Aspart (NOVOLOG MIX 70/30 FLEXPEN) (70-30) 100  UNIT/ML Pen Inject up to 40 units up to 15 mins before breakfast and supper 02/11/14 02/11/15  Gillian Scarceobyn K Zanard, MD  Insulin Degludec 200 UNIT/ML SOPN Inject into the skin. 02/16/16 02/15/17  Historical Provider, MD  Insulin Glargine (LANTUS SOLOSTAR) 100 UNIT/ML Solostar Pen Inject into the skin. 08/23/16 08/23/17  Historical Provider, MD  Insulin Glargine 300 UNIT/ML SOPN Inject into the skin. 08/23/16 08/23/17  Historical Provider, MD  Insulin Pen Needle (NOVOFINE) 32G X 6 MM MISC Use 1 pen needle twice a day 07/30/13   Gillian Scarceobyn K Zanard, MD  lisdexamfetamine (VYVANSE) 50 MG capsule Take 50 mg by mouth. 06/24/16   Historical Provider, MD  Lorcaserin HCl ER 20 MG TB24 Take 20 mg by mouth. 05/18/16   Historical Provider, MD  meclizine (ANTIVERT) 25 MG tablet Take 25 mg by mouth.    Historical Provider, MD  metFORMIN (GLUCOPHAGE-XR) 500 MG 24 hr tablet Take 1,000 mg by mouth. 08/23/16 08/23/17  Historical Provider, MD  metolazone (ZAROXOLYN) 5 MG tablet Take 5 mg by mouth. 08/23/16 08/23/17  Historical Provider, MD  pantoprazole (PROTONIX) 40 MG tablet  09/30/16   Historical Provider, MD  potassium chloride SA (K-DUR,KLOR-CON) 20 MEQ tablet Take 1-2 tablets daily as directed 06/24/16 06/24/17  Historical Provider, MD  pravastatin (PRAVACHOL) 40 MG tablet Take 1 tablet (40 mg total) by mouth every evening. 07/30/13 07/30/14  Gillian Scarceobyn K Zanard, MD  telmisartan (MICARDIS) 80 MG tablet Take 80 mg by mouth. 01/13/16 01/12/17  Historical Provider, MD  traMADol (ULTRAM) 50 MG tablet Take 50 mg by mouth. 08/23/16 08/23/17  Historical Provider, MD  Vilazodone HCl (VIIBRYD) 40 MG TABS Take 1 tablet (40 mg total) by mouth daily. 07/30/13 07/30/14  Gillian Scarceobyn K Zanard, MD  Vitamin D, Ergocalciferol, (DRISDOL) 50000 units CAPS capsule Take by mouth. 03/28/16 03/28/17  Historical Provider, MD    Allergies Cinnamon flavor and Ace inhibitors   REVIEW OF SYSTEMS  Negative except as noted here or in the History of Present Illness.   PHYSICAL EXAMINATION    Initial Vital Signs Blood pressure 153/86, pulse 68, temperature 98.3 F (36.8 C), temperature source Oral, resp. rate 16, height 5\' 4"  (1.626 m), weight 250 lb (113.4 kg), SpO2 100 %.  Examination General: Well-developed, well-nourished female in no acute distress; appearance consistent with age of record HENT: normocephalic; atraumatic Eyes: pupils equal, round and reactive to light; extraocular muscles intact Neck: supple Heart: regular rate and rhythm Lungs: clear to auscultation bilaterally Abdomen: soft; nondistended; nontender; bowel sounds present Rectal: stool light brown Extremities: No deformity; full range of motion; pulses normal; 2+ pitting edema of lower legs which is somewhat worse on the right; popliteal and calf tenderness on the right Neurologic: Awake, alert and oriented; motor function intact in all extremities and symmetric; no facial droop Skin: Warm and dry Psychiatric: Normal mood and affect  RESULTS  Summary of this visit's results, reviewed by  myself:   EKG Interpretation  Date/Time:    Ventricular Rate:    PR Interval:    QRS Duration:   QT Interval:    QTC Calculation:   R Axis:     Text Interpretation:        Laboratory Studies: Results for orders placed or performed during the hospital encounter of 01/26/17 (from the past 24 hour(s))  Occult blood card to lab, stool Provider will collect     Status: None   Collection Time: 01/26/17 11:20 PM  Result Value Ref Range   Fecal Occult Bld NEGATIVE NEGATIVE   Imaging Studies: No results found.  ED COURSE  Nursing notes and initial vitals signs, including pulse oximetry, reviewed.  Vitals:   01/26/17 1837 01/26/17 2125 01/26/17 2334  BP: 165/79 153/86 189/83  Pulse: 72 68 69  Resp: 20 16 14   Temp: 98.3 F (36.8 C)    TempSrc: Oral    SpO2: 98% 100% 100%  Weight: 250 lb (113.4 kg)    Height: 5\' 4"  (1.626 m)     11:41 PM Nursing staff was unable to get blood for lab studies. Since the  concern is for DVT we will have her return for Doppler studies tomorrow.  PROCEDURES    ED DIAGNOSES     ICD-9-CM ICD-10-CM   1. Bilateral lower extremity edema 782.3 R60.0      I personally performed the services described in this documentation, which was scribed in my presence. The recorded information has been reviewed and is accurate.     Paula Libra, MD 01/26/17 [phone]

## 2017-01-26 NOTE — ED Notes (Signed)
NURSE FIRST NOTE: Pt is unsure of her medications or dosages, "I take a lot of them, I can't remember..." pt encouraged to take photos on her cell phone of bottles so she always has them with her. Her companion is going home to retreive her home medications so they can be listed in her chart.

## 2017-01-26 NOTE — ED Notes (Signed)
Unable to obtain IV site.

## 2017-01-26 NOTE — ED Notes (Signed)
Pt. Reports she has had edema in the R and L foot for a while now but the R foot is unusually swollen now since last Thursday.

## 2017-01-26 NOTE — ED Notes (Signed)
ED Provider at bedside. 

## 2017-01-26 NOTE — ED Triage Notes (Signed)
Right leg swelling in the ankle up to the knee since last week, called PCP and made them aware. Reports that she's continuing to check her sugars and take her fluid pill but has no relief.

## 2017-01-27 ENCOUNTER — Ambulatory Visit (HOSPITAL_BASED_OUTPATIENT_CLINIC_OR_DEPARTMENT_OTHER)
Admit: 2017-01-27 | Discharge: 2017-01-27 | Disposition: A | Discharge status: Home / Self Care | Payer: BC Managed Care – PPO | Attending: Emergency Medicine | Admitting: Emergency Medicine

## 2017-01-27 DIAGNOSIS — M7989 Other specified soft tissue disorders: Secondary | ICD-10-CM

## 2017-01-27 NOTE — ED Notes (Signed)
Pt verbalizes understanding of d/c instructions and denies any further needs at this time. 

## 2017-02-13 DIAGNOSIS — M961 Postlaminectomy syndrome, not elsewhere classified: Secondary | ICD-10-CM | POA: Insufficient documentation

## 2017-02-13 DIAGNOSIS — M47816 Spondylosis without myelopathy or radiculopathy, lumbar region: Secondary | ICD-10-CM | POA: Insufficient documentation

## 2017-02-13 DIAGNOSIS — M48061 Spinal stenosis, lumbar region without neurogenic claudication: Secondary | ICD-10-CM | POA: Insufficient documentation

## 2017-02-13 DIAGNOSIS — G629 Polyneuropathy, unspecified: Secondary | ICD-10-CM | POA: Insufficient documentation

## 2017-04-05 ENCOUNTER — Ambulatory Visit: Payer: BC Managed Care – PPO | Admitting: Podiatry

## 2017-04-20 ENCOUNTER — Ambulatory Visit: Payer: BC Managed Care – PPO | Admitting: Podiatry

## 2017-04-25 DIAGNOSIS — L03115 Cellulitis of right lower limb: Secondary | ICD-10-CM | POA: Insufficient documentation

## 2017-09-05 ENCOUNTER — Ambulatory Visit: Payer: BC Managed Care – PPO | Admitting: Podiatry

## 2017-10-17 ENCOUNTER — Ambulatory Visit: Payer: BC Managed Care – PPO | Admitting: Podiatry

## 2017-12-08 ENCOUNTER — Emergency Department (HOSPITAL_BASED_OUTPATIENT_CLINIC_OR_DEPARTMENT_OTHER)
Admission: EM | Admit: 2017-12-08 | Discharge: 2017-12-08 | Disposition: A | Discharge status: Home / Self Care | Payer: BC Managed Care – PPO | Attending: Emergency Medicine | Admitting: Emergency Medicine

## 2017-12-08 ENCOUNTER — Encounter (HOSPITAL_BASED_OUTPATIENT_CLINIC_OR_DEPARTMENT_OTHER): Payer: Self-pay | Admitting: *Deleted

## 2017-12-08 ENCOUNTER — Other Ambulatory Visit: Payer: Self-pay

## 2017-12-08 DIAGNOSIS — M545 Low back pain, unspecified: Secondary | ICD-10-CM

## 2017-12-08 DIAGNOSIS — E119 Type 2 diabetes mellitus without complications: Secondary | ICD-10-CM | POA: Insufficient documentation

## 2017-12-08 DIAGNOSIS — G8929 Other chronic pain: Secondary | ICD-10-CM | POA: Diagnosis not present

## 2017-12-08 DIAGNOSIS — J45909 Unspecified asthma, uncomplicated: Secondary | ICD-10-CM | POA: Insufficient documentation

## 2017-12-08 DIAGNOSIS — Z7902 Long term (current) use of antithrombotics/antiplatelets: Secondary | ICD-10-CM | POA: Insufficient documentation

## 2017-12-08 DIAGNOSIS — Z7982 Long term (current) use of aspirin: Secondary | ICD-10-CM | POA: Diagnosis not present

## 2017-12-08 DIAGNOSIS — I10 Essential (primary) hypertension: Secondary | ICD-10-CM | POA: Diagnosis not present

## 2017-12-08 DIAGNOSIS — Z794 Long term (current) use of insulin: Secondary | ICD-10-CM | POA: Diagnosis not present

## 2017-12-08 MED ORDER — DEXAMETHASONE SODIUM PHOSPHATE 10 MG/ML IJ SOLN
10.0000 mg | Freq: Once | INTRAMUSCULAR | Status: AC
  Administered 2017-12-08: 10 mg via INTRAMUSCULAR
  Filled 2017-12-08: qty 1

## 2017-12-08 MED ORDER — IBUPROFEN 800 MG PO TABS: 800.0000 mg | ORAL_TABLET | Freq: Three times a day (TID) | ORAL | 0 refills | Status: DC

## 2017-12-08 MED ORDER — METHOCARBAMOL 500 MG PO TABS: 500.0000 mg | ORAL_TABLET | Freq: Three times a day (TID) | ORAL | 0 refills | Status: DC | PRN

## 2017-12-08 MED ORDER — TRAMADOL HCL 50 MG PO TABS: 50.0000 mg | ORAL_TABLET | Freq: Four times a day (QID) | ORAL | 0 refills | Status: AC | PRN

## 2017-12-08 MED FILL — METHOCARBAMOL 500 MG TABS: 500 | 6 days supply | Qty: 20 | Fill #0

## 2017-12-08 MED FILL — IBUPROFEN 800 MG TAB: 800 | 7 days supply | Qty: 21 | Fill #0

## 2017-12-08 MED FILL — traMADol HCL 50 MG TABS: 50 | 3 days supply | Qty: 15 | Fill #0

## 2017-12-08 NOTE — ED Triage Notes (Signed)
Pt reports "many years" of back pain, worse since she had a minor fall at home around a month ago. Pain is to left back/buttock and radiates to her left hip and down left leg.
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
## 2017-12-08 NOTE — Discharge Instructions (Signed)
Medications as prescribed. Follow-up with your primary care physician as scheduled.

## 2018-03-22 DIAGNOSIS — M25462 Effusion, left knee: Secondary | ICD-10-CM | POA: Insufficient documentation

## 2018-03-28 ENCOUNTER — Ambulatory Visit: Payer: BC Managed Care – PPO | Admitting: Podiatry

## 2018-04-10 ENCOUNTER — Ambulatory Visit: Payer: BC Managed Care – PPO | Admitting: Podiatry

## 2018-04-26 ENCOUNTER — Ambulatory Visit: Payer: BC Managed Care – PPO | Admitting: Podiatry

## 2018-04-26 ENCOUNTER — Encounter: Payer: Self-pay | Admitting: Podiatry

## 2018-04-26 DIAGNOSIS — E0842 Diabetes mellitus due to underlying condition with diabetic polyneuropathy: Secondary | ICD-10-CM

## 2018-04-26 DIAGNOSIS — B351 Tinea unguium: Secondary | ICD-10-CM | POA: Diagnosis not present

## 2018-04-26 NOTE — Progress Notes (Signed)
Subjective: 62 year old female presents complaining of painful feet. She has neuropathy and nails growing under the toes. Been diabetic x 10 years. Has not been able to check her blood sugar. Just [facility] for lesions on left leg.  DERMATOLOGIC EXAMINATION: Severe dystrophic nails x 10. Ingrown nails at distal end bilateral.  VASCULAR EXAMINATION OF LOWER LIMBS: Excess ankle and foot edema bilateral. Pedal pulses not palpable due to excess edema. NEUROLOGIC EXAMINATION OF THE LOWER LIMBS: Subjective pain, tingling and numbness since 2016.   MUSCULOSKELETAL EXAMINATION: Hallux valgus with bunion bilateral L>R. 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 Severe Subtalar and ankle joint pronation bilateral with more pain on left instep.  ASSESSMENT: Onychomycosis. Diabetic neuropathy. Painful feet.  Plan: All nails debrided. Return in 3 months.

## 2018-04-26 NOTE — Patient Instructions (Signed)
Seen for hypertrophic nails. All nails debrided. Return in 3 months or as needed.  

## 2018-07-05 ENCOUNTER — Ambulatory Visit: Payer: BC Managed Care – PPO | Admitting: Podiatry

## 2019-01-14 DIAGNOSIS — M533 Sacrococcygeal disorders, not elsewhere classified: Secondary | ICD-10-CM | POA: Insufficient documentation

## 2019-04-01 DIAGNOSIS — I872 Venous insufficiency (chronic) (peripheral): Secondary | ICD-10-CM | POA: Insufficient documentation

## 2019-11-29 ENCOUNTER — Emergency Department (HOSPITAL_BASED_OUTPATIENT_CLINIC_OR_DEPARTMENT_OTHER)
Admission: EM | Admit: 2019-11-29 | Discharge: 2019-11-29 | Disposition: A | Discharge status: Home / Self Care | Payer: BC Managed Care – PPO | Attending: Emergency Medicine | Admitting: Emergency Medicine

## 2019-11-29 ENCOUNTER — Other Ambulatory Visit: Payer: Self-pay

## 2019-11-29 ENCOUNTER — Encounter (HOSPITAL_BASED_OUTPATIENT_CLINIC_OR_DEPARTMENT_OTHER): Payer: Self-pay

## 2019-11-29 DIAGNOSIS — E119 Type 2 diabetes mellitus without complications: Secondary | ICD-10-CM | POA: Insufficient documentation

## 2019-11-29 DIAGNOSIS — I10 Essential (primary) hypertension: Secondary | ICD-10-CM | POA: Insufficient documentation

## 2019-11-29 DIAGNOSIS — R519 Headache, unspecified: Secondary | ICD-10-CM | POA: Diagnosis present

## 2019-11-29 DIAGNOSIS — G43909 Migraine, unspecified, not intractable, without status migrainosus: Secondary | ICD-10-CM | POA: Diagnosis not present

## 2019-11-29 DIAGNOSIS — Z79899 Other long term (current) drug therapy: Secondary | ICD-10-CM | POA: Insufficient documentation

## 2019-11-29 DIAGNOSIS — U071 COVID-19: Secondary | ICD-10-CM | POA: Diagnosis not present

## 2019-11-29 DIAGNOSIS — Z888 Allergy status to other drugs, medicaments and biological substances status: Secondary | ICD-10-CM | POA: Insufficient documentation

## 2019-11-29 DIAGNOSIS — Z794 Long term (current) use of insulin: Secondary | ICD-10-CM | POA: Insufficient documentation

## 2019-11-29 DIAGNOSIS — E785 Hyperlipidemia, unspecified: Secondary | ICD-10-CM | POA: Diagnosis not present

## 2019-11-29 HISTORY — DX: Dorsalgia, unspecified: M54.9

## 2019-11-29 LAB — CBG MONITORING, ED: Glucose-Capillary: 77 mg/dL (ref 70–99)

## 2019-11-29 LAB — SARS CORONAVIRUS 2 AG (30 MIN TAT): SARS Coronavirus 2 Ag: POSITIVE — AB

## 2019-11-29 MED ORDER — SODIUM CHLORIDE 0.9 % IV BOLUS
500.0000 mL | Freq: Once | INTRAVENOUS | Status: AC
  Administered 2019-11-29: 16:00:00 500 mL via INTRAVENOUS

## 2019-11-29 MED ORDER — KETOROLAC TROMETHAMINE 30 MG/ML IJ SOLN
30.0000 mg | Freq: Once | INTRAMUSCULAR | Status: AC
  Administered 2019-11-29: 17:00:00 30 mg via INTRAVENOUS
  Filled 2019-11-29: qty 1

## 2019-11-29 MED ORDER — PROCHLORPERAZINE EDISYLATE 10 MG/2ML IJ SOLN
10.0000 mg | Freq: Once | INTRAMUSCULAR | Status: AC
  Administered 2019-11-29: 16:00:00 10 mg via INTRAVENOUS
  Filled 2019-11-29: qty 2

## 2019-11-29 MED ORDER — DIPHENHYDRAMINE HCL 50 MG/ML IJ SOLN
25.0000 mg | Freq: Once | INTRAMUSCULAR | Status: AC
  Administered 2019-11-29: 16:00:00 25 mg via INTRAVENOUS
  Filled 2019-11-29: qty 1
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
## 2019-11-29 NOTE — ED Triage Notes (Signed)
Pt c/o HA, runny nose, cough x 2 days-NAD-steady gait

## 2019-11-30 ENCOUNTER — Telehealth: Payer: Self-pay | Admitting: Unknown Physician Specialty

## 2019-11-30 NOTE — Telephone Encounter (Signed)
Called to discuss with patient about Covid symptoms and the use of bamlanivimab, a monoclonal antibody infusion for those with mild to moderate Covid symptoms and at a high risk of hospitalization.  Pt is qualified for this infusion at the [facility] infusion center due to Age >55 and Hypertension   Message left to call back  

## 2019-12-02 ENCOUNTER — Telehealth (HOSPITAL_COMMUNITY): Payer: Self-pay | Admitting: Critical Care Medicine

## 2019-12-02 ENCOUNTER — Other Ambulatory Visit (HOSPITAL_COMMUNITY): Payer: Self-pay | Admitting: Critical Care Medicine

## 2019-12-02 ENCOUNTER — Telehealth: Payer: Self-pay | Admitting: Unknown Physician Specialty

## 2019-12-02 DIAGNOSIS — U071 COVID-19: Secondary | ICD-10-CM

## 2019-12-02 DIAGNOSIS — Z6841 Body Mass Index (BMI) 40.0 and over, adult: Secondary | ICD-10-CM

## 2019-12-02 DIAGNOSIS — E1165 Type 2 diabetes mellitus with hyperglycemia: Secondary | ICD-10-CM

## 2019-12-02 DIAGNOSIS — I10 Essential (primary) hypertension: Secondary | ICD-10-CM

## 2019-12-02 NOTE — Progress Notes (Signed)
  I connected by phone with Jorje Guild on 12/02/2019 at 6:02 PM to discuss the potential use of an new treatment for mild to moderate COVID-19 viral infection in non-hospitalized patients.  This patient is a 64 y.o. female that meets the FDA criteria for Emergency Use Authorization of bamlanivimab or casirivimab\imdevimab.  Has a (+) direct SARS-CoV-2 viral test result  Has mild or moderate COVID-19   Is ? 64 years of age and weighs ? 40 kg  Is NOT hospitalized due to COVID-19  Is NOT requiring oxygen therapy or requiring an increase in baseline oxygen flow rate due to COVID-19  Is within 10 days of symptom onset  Has at least one of the high risk factor(s) for progression to severe COVID-19 and/or hospitalization as defined in EUA.  Specific high risk criteria : BMI >/= 35 DM, HTN    I have spoken and communicated the following to the patient or parent/caregiver:  1. FDA has authorized the emergency use of bamlanivimab and casirivimab\imdevimab for the treatment of mild to moderate COVID-19 in adults and pediatric patients with positive results of direct SARS-CoV-2 viral testing who are 20 years of age and older weighing at least 40 kg, and who are at high risk for progressing to severe COVID-19 and/or hospitalization.  2. The significant known and potential risks and benefits of bamlanivimab and casirivimab\imdevimab, and the extent to which such potential risks and benefits are unknown.  3. Information on available alternative treatments and the risks and benefits of those alternatives, including clinical trials.  4. Patients treated with bamlanivimab and casirivimab\imdevimab should continue to self-isolate and use infection control measures (e.g., wear mask, isolate, social distance, avoid sharing personal items, clean and disinfect "high touch" surfaces, and frequent handwashing) according to CDC guidelines.   5. The patient or parent/caregiver has the option to accept or  refuse bamlanivimab or casirivimab\imdevimab .  After reviewing this information with the patient, The patient agreed to proceed with receiving the bamlanimivab infusion and will be provided a copy of the Fact sheet prior to receiving the infusion.Shan Levans 12/02/2019 6:02 PM

## 2019-12-02 NOTE — Telephone Encounter (Signed)
Discussed with patient about Covid symptoms and the use of bamlanivimab, a monoclonal antibody infusion for those with mild to moderate Covid symptoms and at a high risk of hospitalization.  Pt is not qualified for this infusion at the [facility] infusion center due to sx onset >10 days 112/23 Sx onset

## 2019-12-02 NOTE — Telephone Encounter (Signed)
I connected with this patient who is Covid positive from January 4 her symptoms began on December 30 she has been having difficulty with severe fatigue cough muscle aches and she is high risk secondary to her weight  She has agreed to monoclonal antibody infusion will come in on January 7 Called to discuss with patient about Covid symptoms and the use of bamlanivimab, a monoclonal antibody infusion for those with mild to moderate Covid symptoms and at a high risk of hospitalization.  Pt is qualified for this infusion at the [facility] infusion center due to Age >55 and BMI>35  HTN DM2

## 2019-12-05 ENCOUNTER — Ambulatory Visit (HOSPITAL_COMMUNITY)
Admission: RE | Admit: 2019-12-05 | Discharge: 2019-12-05 | Disposition: A | Discharge status: Home / Self Care | Payer: BC Managed Care – PPO | Source: Ambulatory Visit | Attending: Pulmonary Disease | Admitting: Pulmonary Disease

## 2019-12-05 DIAGNOSIS — U071 COVID-19: Secondary | ICD-10-CM | POA: Insufficient documentation

## 2019-12-05 DIAGNOSIS — I10 Essential (primary) hypertension: Secondary | ICD-10-CM | POA: Diagnosis not present

## 2019-12-05 DIAGNOSIS — Z6841 Body Mass Index (BMI) 40.0 and over, adult: Secondary | ICD-10-CM | POA: Diagnosis not present

## 2019-12-05 DIAGNOSIS — E1165 Type 2 diabetes mellitus with hyperglycemia: Secondary | ICD-10-CM | POA: Diagnosis not present

## 2019-12-05 MED ORDER — EPINEPHRINE 0.3 MG/0.3ML IJ SOAJ: 0.3000 mg | Freq: Once | INTRAMUSCULAR | Status: DC | PRN

## 2019-12-05 MED ORDER — FAMOTIDINE IN NACL 20-0.9 MG/50ML-% IV SOLN: 20.0000 mg | Freq: Once | INTRAVENOUS | Status: DC | PRN

## 2019-12-05 MED ORDER — SODIUM CHLORIDE 0.9 % IV SOLN
INTRAVENOUS | Status: DC | PRN
  Administered 2019-12-05: 250 mL via INTRAVENOUS

## 2019-12-05 MED ORDER — METHYLPREDNISOLONE SODIUM SUCC 125 MG IJ SOLR: 125.0000 mg | Freq: Once | INTRAMUSCULAR | Status: DC | PRN

## 2019-12-05 MED ORDER — SODIUM CHLORIDE 0.9 % IV SOLN
700.0000 mg | Freq: Once | INTRAVENOUS | Status: AC
  Administered 2019-12-05: 15:00:00 700 mg via INTRAVENOUS
  Filled 2019-12-05: qty 20

## 2019-12-05 MED ORDER — DIPHENHYDRAMINE HCL 50 MG/ML IJ SOLN: 50.0000 mg | Freq: Once | INTRAMUSCULAR | Status: DC | PRN

## 2019-12-05 MED ORDER — ALBUTEROL SULFATE HFA 108 (90 BASE) MCG/ACT IN AERS: 2.0000 | INHALATION_SPRAY | Freq: Once | RESPIRATORY_TRACT | Status: DC | PRN

## 2019-12-05 NOTE — Progress Notes (Signed)
  Diagnosis: COVID-19  Physician:  Procedure: Covid Infusion Clinic Med: bamlanivimab infusion - Provided patient with bamlanimivab fact sheet for patients, parents and caregivers prior to infusion.  Complications: No immediate complications noted.  Discharge: Discharged home   Heber Hoog 12/05/2019  

## 2019-12-05 NOTE — Discharge Instructions (Signed)

## 2020-09-04 ENCOUNTER — Other Ambulatory Visit: Payer: Self-pay

## 2020-09-04 ENCOUNTER — Emergency Department (HOSPITAL_BASED_OUTPATIENT_CLINIC_OR_DEPARTMENT_OTHER)
Admission: EM | Admit: 2020-09-04 | Discharge: 2020-09-04 | Disposition: A | Discharge status: Home / Self Care | Payer: BC Managed Care – PPO | Attending: Emergency Medicine | Admitting: Emergency Medicine

## 2020-09-04 ENCOUNTER — Encounter (HOSPITAL_BASED_OUTPATIENT_CLINIC_OR_DEPARTMENT_OTHER): Payer: Self-pay | Admitting: *Deleted

## 2020-09-04 ENCOUNTER — Emergency Department (HOSPITAL_BASED_OUTPATIENT_CLINIC_OR_DEPARTMENT_OTHER): Payer: BC Managed Care – PPO

## 2020-09-04 DIAGNOSIS — G5602 Carpal tunnel syndrome, left upper limb: Secondary | ICD-10-CM | POA: Insufficient documentation

## 2020-09-04 DIAGNOSIS — J45909 Unspecified asthma, uncomplicated: Secondary | ICD-10-CM | POA: Diagnosis not present

## 2020-09-04 DIAGNOSIS — Z7982 Long term (current) use of aspirin: Secondary | ICD-10-CM | POA: Insufficient documentation

## 2020-09-04 DIAGNOSIS — E119 Type 2 diabetes mellitus without complications: Secondary | ICD-10-CM | POA: Diagnosis not present

## 2020-09-04 DIAGNOSIS — I10 Essential (primary) hypertension: Secondary | ICD-10-CM | POA: Diagnosis not present

## 2020-09-04 DIAGNOSIS — M79642 Pain in left hand: Secondary | ICD-10-CM | POA: Diagnosis present

## 2020-09-04 DIAGNOSIS — Z79899 Other long term (current) drug therapy: Secondary | ICD-10-CM | POA: Insufficient documentation

## 2020-09-04 DIAGNOSIS — M25532 Pain in left wrist: Secondary | ICD-10-CM

## 2020-09-04 DIAGNOSIS — Z794 Long term (current) use of insulin: Secondary | ICD-10-CM | POA: Insufficient documentation

## 2020-09-04 MED ORDER — CARVEDILOL 12.5 MG PO TABS
25.0000 mg | ORAL_TABLET | Freq: Once | ORAL | Status: AC
  Administered 2020-09-04: 25 mg via ORAL
  Filled 2020-09-04: qty 2

## 2020-09-04 NOTE — Discharge Instructions (Signed)
Please follow up with Dr. Jordan Likes for further evaluation.  Wear wrist brace as indicated throughout the day when you are using your hands as well as at nighttime.  While at home resting you can take the brace off; apply ice. Take Ibuprofen and Tylenol as needed for pain.  Return to the ED for any worsening symptoms.

## 2020-09-04 NOTE — ED Triage Notes (Signed)
C/o left hand and wrist pain w/o injury x 1 day
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
## 2020-09-16 DIAGNOSIS — Z79899 Other long term (current) drug therapy: Secondary | ICD-10-CM | POA: Insufficient documentation

## 2020-11-12 DIAGNOSIS — F320 Major depressive disorder, single episode, mild: Secondary | ICD-10-CM | POA: Insufficient documentation

## 2021-04-08 ENCOUNTER — Encounter (HOSPITAL_COMMUNITY): Payer: Self-pay | Admitting: Emergency Medicine

## 2021-04-08 ENCOUNTER — Emergency Department (HOSPITAL_COMMUNITY)
Admission: EM | Admit: 2021-04-08 | Discharge: 2021-04-08 | Disposition: A | Discharge status: Home / Self Care | Payer: BC Managed Care – PPO | Attending: Emergency Medicine | Admitting: Emergency Medicine

## 2021-04-08 ENCOUNTER — Other Ambulatory Visit: Payer: Self-pay

## 2021-04-08 ENCOUNTER — Emergency Department (HOSPITAL_COMMUNITY): Payer: BC Managed Care – PPO

## 2021-04-08 DIAGNOSIS — Z7984 Long term (current) use of oral hypoglycemic drugs: Secondary | ICD-10-CM | POA: Diagnosis not present

## 2021-04-08 DIAGNOSIS — Z7982 Long term (current) use of aspirin: Secondary | ICD-10-CM | POA: Diagnosis not present

## 2021-04-08 DIAGNOSIS — R55 Syncope and collapse: Secondary | ICD-10-CM | POA: Diagnosis present

## 2021-04-08 DIAGNOSIS — E119 Type 2 diabetes mellitus without complications: Secondary | ICD-10-CM | POA: Insufficient documentation

## 2021-04-08 DIAGNOSIS — I10 Essential (primary) hypertension: Secondary | ICD-10-CM | POA: Diagnosis not present

## 2021-04-08 DIAGNOSIS — R112 Nausea with vomiting, unspecified: Secondary | ICD-10-CM | POA: Diagnosis not present

## 2021-04-08 DIAGNOSIS — Z794 Long term (current) use of insulin: Secondary | ICD-10-CM | POA: Insufficient documentation

## 2021-04-08 DIAGNOSIS — R197 Diarrhea, unspecified: Secondary | ICD-10-CM | POA: Insufficient documentation

## 2021-04-08 DIAGNOSIS — R519 Headache, unspecified: Secondary | ICD-10-CM | POA: Diagnosis not present

## 2021-04-08 DIAGNOSIS — Z79899 Other long term (current) drug therapy: Secondary | ICD-10-CM | POA: Diagnosis not present

## 2021-04-08 LAB — CBC
HCT: 40.9 % (ref 36.0–46.0)
Hemoglobin: 12.6 g/dL (ref 12.0–15.0)
MCH: 29.4 pg (ref 26.0–34.0)
MCHC: 30.8 g/dL (ref 30.0–36.0)
MCV: 95.3 fL (ref 80.0–100.0)
Platelets: 237 10*3/uL (ref 150–400)
RBC: 4.29 MIL/uL (ref 3.87–5.11)
RDW: 13.2 % (ref 11.5–15.5)
WBC: 6.9 10*3/uL (ref 4.0–10.5)
nRBC: 0 % (ref 0.0–0.2)

## 2021-04-08 LAB — URINALYSIS, ROUTINE W REFLEX MICROSCOPIC
Bilirubin Urine: NEGATIVE
Glucose, UA: NEGATIVE mg/dL
Hgb urine dipstick: NEGATIVE
Ketones, ur: NEGATIVE mg/dL
Leukocytes,Ua: NEGATIVE
Nitrite: NEGATIVE
Protein, ur: NEGATIVE mg/dL
Specific Gravity, Urine: 1.006 (ref 1.005–1.030)
pH: 8 (ref 5.0–8.0)

## 2021-04-08 LAB — BASIC METABOLIC PANEL
Anion gap: 6 (ref 5–15)
BUN: 11 mg/dL (ref 8–23)
CO2: 28 mmol/L (ref 22–32)
Calcium: 9.2 mg/dL (ref 8.9–10.3)
Chloride: 108 mmol/L (ref 98–111)
Creatinine, Ser: 0.87 mg/dL (ref 0.44–1.00)
GFR, Estimated: >60 mL/min (ref >60)
Glucose, Bld: 64 mg/dL — ABNORMAL LOW (ref 70–99)
Potassium: 4.1 mmol/L (ref 3.5–5.1)
Sodium: 142 mmol/L (ref 135–145)

## 2021-04-08 LAB — CBG MONITORING, ED
Glucose-Capillary: 63 mg/dL — ABNORMAL LOW (ref 70–99)
Glucose-Capillary: 88 mg/dL (ref 70–99)

## 2021-04-08 MED ORDER — SODIUM CHLORIDE 0.9 % IV BOLUS
1000.0000 mL | Freq: Once | INTRAVENOUS | Status: AC
  Administered 2021-04-08: 1000 mL via INTRAVENOUS

## 2021-04-08 NOTE — ED Notes (Signed)
Pt given cranberry juice for low blood sugar.

## 2021-04-08 NOTE — ED Notes (Signed)
Multiple attempts at IV access. IV consult placed.

## 2021-04-08 NOTE — ED Triage Notes (Signed)
Pt was at work and started feeling dizzy, lethargic, sweating at 0830. Pt felt these were her normal vertigo symptoms. Pt was seen at [facility] a week ago for dehydration- pt had similar symptoms with this as well. Pt arrived to ED- feels much better, only has headache. Pt did not take BP meds and did not eat breakfast. BP 208/94, HR 65, EKG sinus rhythm. CBG 74 (150-300 is her norm). EMS gave her 25g packet of carbs- CBG 72 after but pt feels much better.

## 2021-04-08 NOTE — Discharge Instructions (Signed)
Eat and drink as well as you can for the next 48 hours.  Please return for chest pain trouble breathing worsening headache or if you have a repeat event where he passed out or almost passed out.  Please follow-up with your family doctor.  They may want to reevaluate your medications or consider having you sent for an ultrasound of your heart.
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
## 2021-04-08 NOTE — ED Notes (Signed)
Patient transported to CT 

## 2021-04-08 NOTE — ED Notes (Signed)
Pt to room 24

## 2021-11-26 DIAGNOSIS — H2510 Age-related nuclear cataract, unspecified eye: Secondary | ICD-10-CM | POA: Insufficient documentation

## 2022-01-05 IMAGING — CT CT HEAD W/O CM
4 series · 16 of 47 positions shown, 18 images · non-contrast
Comparison: None.

CLINICAL DATA: Headache, dizziness

EXAM:
CT HEAD WITHOUT CONTRAST
TECHNIQUE: Contiguous axial images were obtained from the base of the skull
through the vertex without intravenous contrast.

[Series 3: head bone · axial · 0.47mm/px · z∈[-218,-190]mm · 3 of 73 slices shown]
[im 8/73  bone]
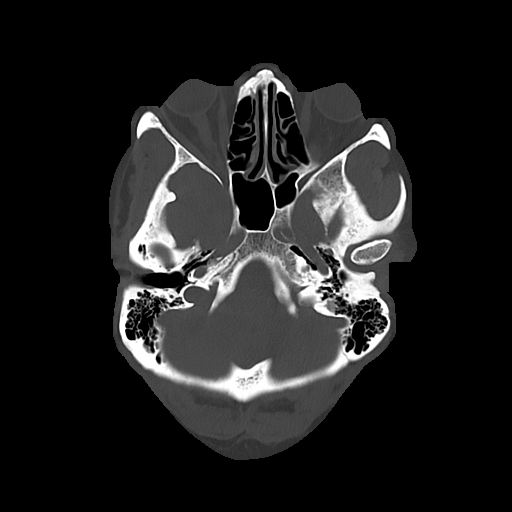
[im 15/73  bone]
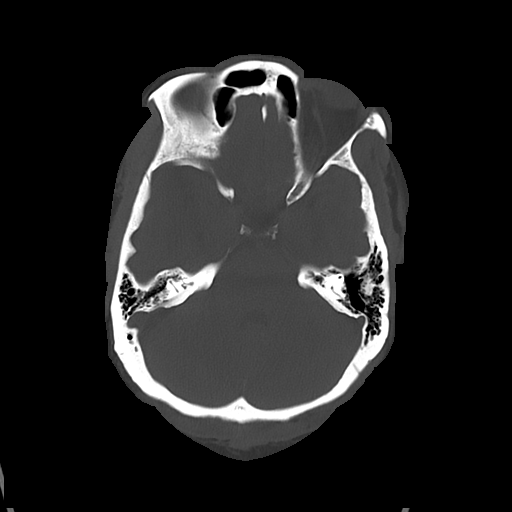
[im 22/73  bone]
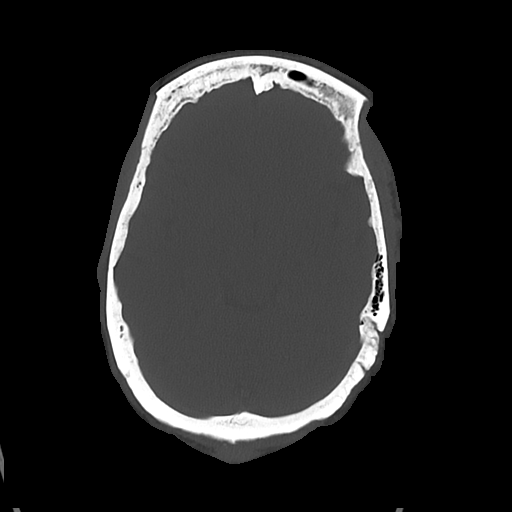

[Series 4: head wo · axial · 0.47mm/px · z∈[-217,-112]mm · 7 of 29 slices shown, 9 images]
[im 4/29  brain]
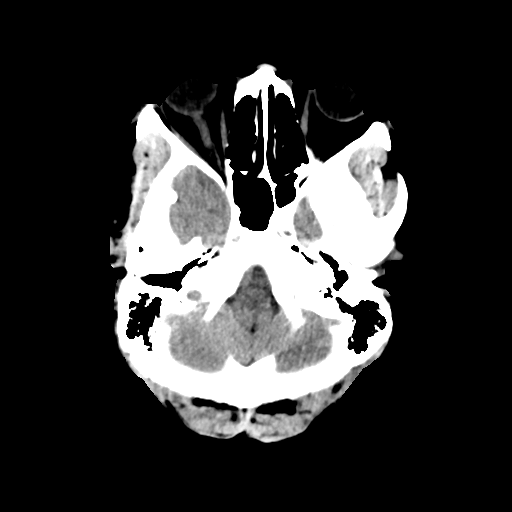
[im 4/29  bone]
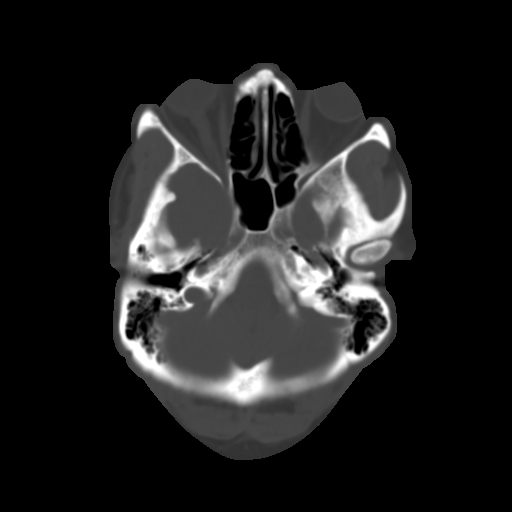
[im 8/29  brain]
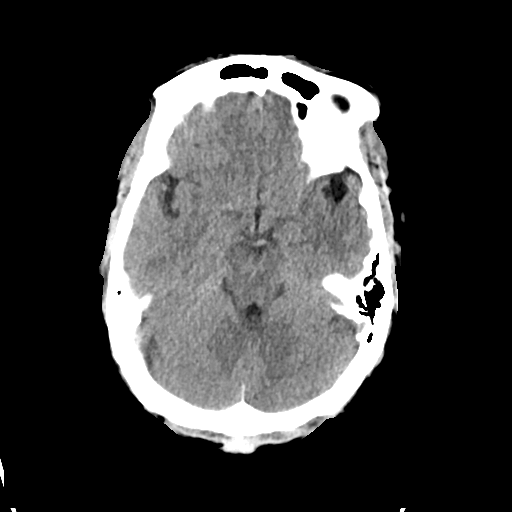
[im 11/29  brain]
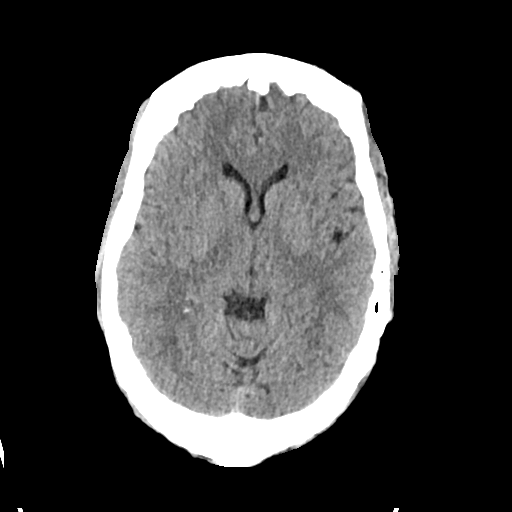
[im 15/29  brain]
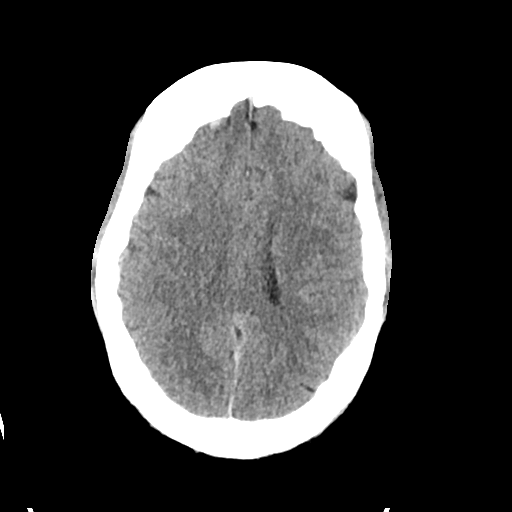
[im 18/29  brain]
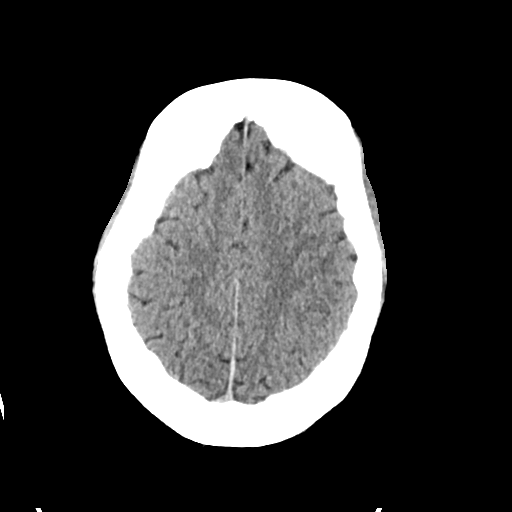
[im 18/29  bone]
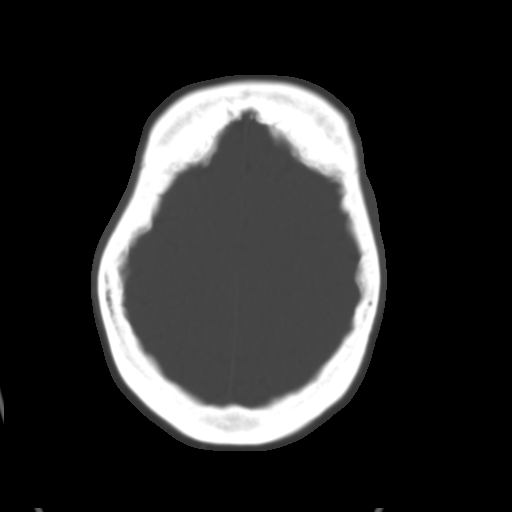
[im 22/29  brain]
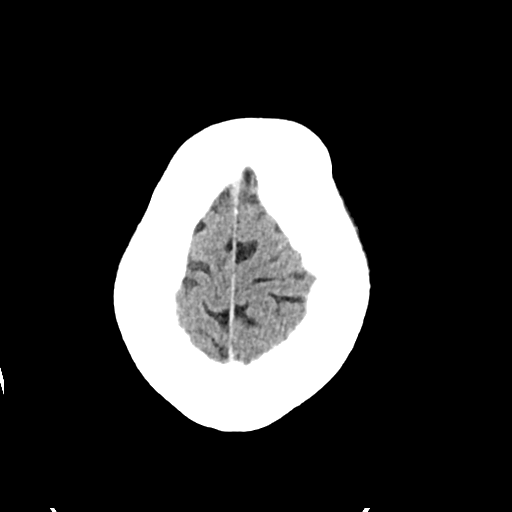
[im 25/29  brain]
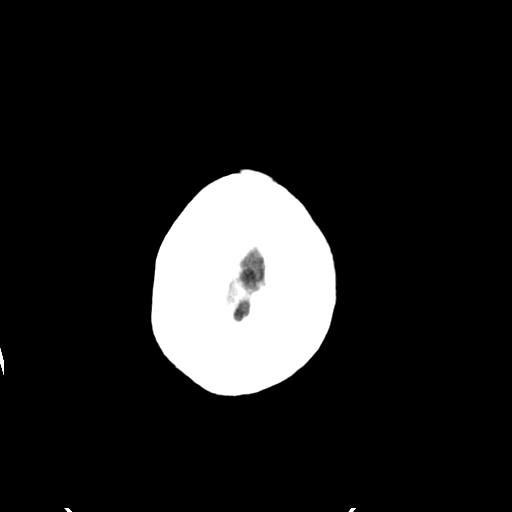

[Series 5: cor soft · coronal · 0.31mm/px · 3 of 69 slices shown]
[im 23/69  brain]
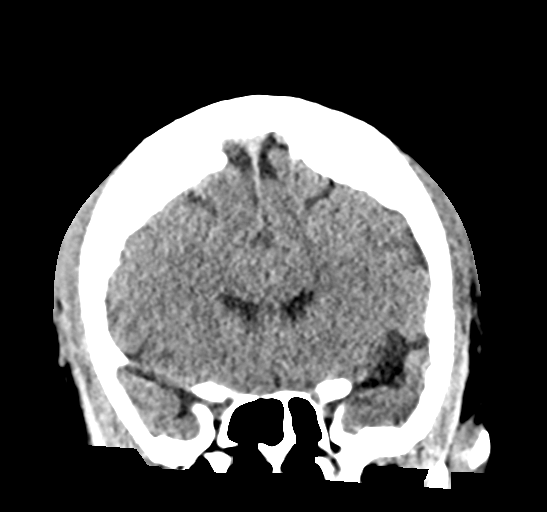
[im 31/69  brain]
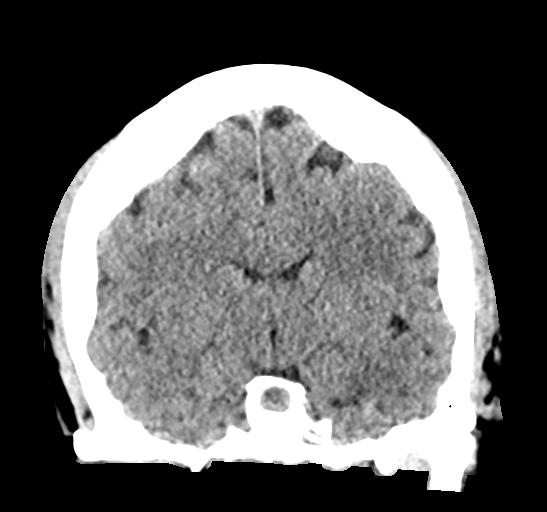
[im 38/69  brain]
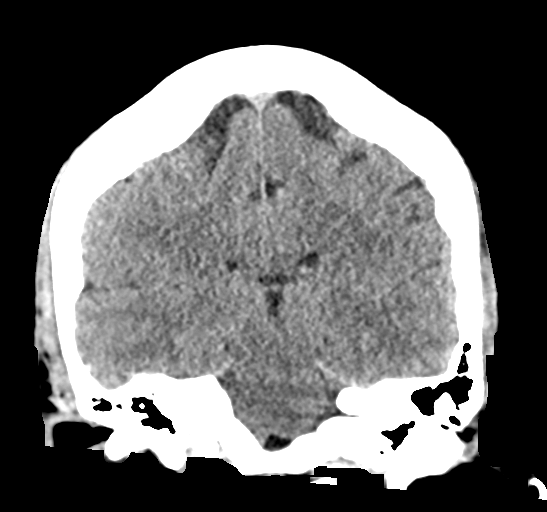

[Series 6: sag soft · sagittal · 0.32mm/px · 3 of 54 slices shown]
[im 18/54  brain]
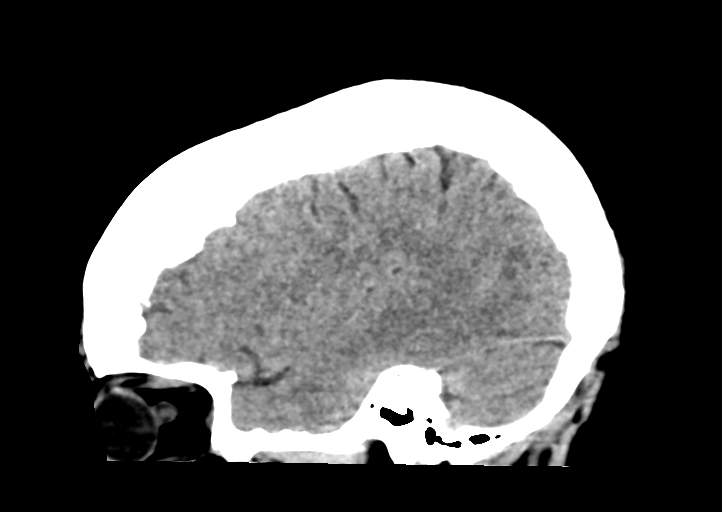
[im 27/54  brain]
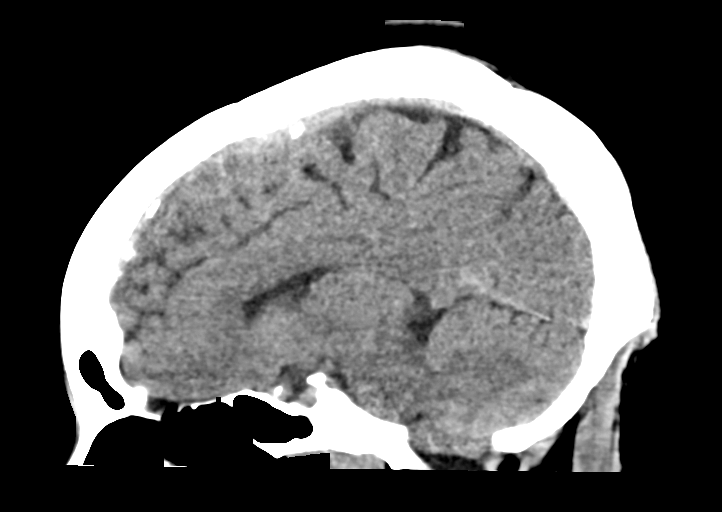
[im 36/54  brain]
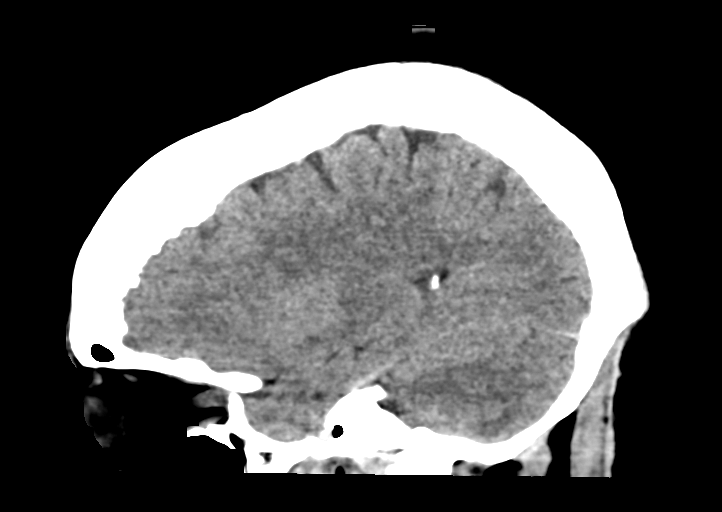

[16 of 47 positions shown; findings below may reference images not displayed]

FINDINGS: Brain: No acute intracranial abnormality. Specifically, no
hemorrhage, hydrocephalus, mass lesion, acute infarction, or
significant intracranial injury.

Vascular: No hyperdense vessel or unexpected calcification.

Skull: No acute calvarial abnormality.

Sinuses/Orbits: Visualized paranasal sinuses and mastoids clear.
Orbital soft tissues unremarkable.

Other: None
IMPRESSION: No acute intracranial abnormality.

## 2022-03-14 DIAGNOSIS — M2041 Other hammer toe(s) (acquired), right foot: Secondary | ICD-10-CM | POA: Insufficient documentation

## 2022-03-14 DIAGNOSIS — B351 Tinea unguium: Secondary | ICD-10-CM | POA: Insufficient documentation

## 2022-03-14 DIAGNOSIS — M2010 Hallux valgus (acquired), unspecified foot: Secondary | ICD-10-CM | POA: Insufficient documentation

## 2022-04-03 DIAGNOSIS — K56609 Unspecified intestinal obstruction, unspecified as to partial versus complete obstruction: Secondary | ICD-10-CM | POA: Insufficient documentation

## 2022-04-10 DIAGNOSIS — I2699 Other pulmonary embolism without acute cor pulmonale: Secondary | ICD-10-CM | POA: Insufficient documentation

## 2022-04-10 DIAGNOSIS — Z86711 Personal history of pulmonary embolism: Secondary | ICD-10-CM | POA: Insufficient documentation

## 2022-05-06 DIAGNOSIS — I70209 Unspecified atherosclerosis of native arteries of extremities, unspecified extremity: Secondary | ICD-10-CM | POA: Insufficient documentation

## 2022-09-03 DIAGNOSIS — R9431 Abnormal electrocardiogram [ECG] [EKG]: Secondary | ICD-10-CM | POA: Insufficient documentation

## 2022-09-03 DIAGNOSIS — I4510 Unspecified right bundle-branch block: Secondary | ICD-10-CM | POA: Insufficient documentation

## 2022-10-17 DIAGNOSIS — I2510 Atherosclerotic heart disease of native coronary artery without angina pectoris: Secondary | ICD-10-CM | POA: Insufficient documentation

## 2023-11-02 DIAGNOSIS — I480 Paroxysmal atrial fibrillation: Secondary | ICD-10-CM | POA: Insufficient documentation

## 2023-11-07 DIAGNOSIS — I890 Lymphedema, not elsewhere classified: Secondary | ICD-10-CM | POA: Insufficient documentation

## 2023-11-19 ENCOUNTER — Other Ambulatory Visit: Payer: Self-pay

## 2023-11-19 ENCOUNTER — Emergency Department (HOSPITAL_BASED_OUTPATIENT_CLINIC_OR_DEPARTMENT_OTHER)
Admission: EM | Admit: 2023-11-19 | Discharge: 2023-11-19 | Disposition: A | Discharge status: Home / Self Care | Payer: Medicare PPO | Attending: Emergency Medicine | Admitting: Emergency Medicine

## 2023-11-19 ENCOUNTER — Encounter (HOSPITAL_BASED_OUTPATIENT_CLINIC_OR_DEPARTMENT_OTHER): Payer: Self-pay

## 2023-11-19 ENCOUNTER — Emergency Department (HOSPITAL_BASED_OUTPATIENT_CLINIC_OR_DEPARTMENT_OTHER): Payer: Medicare PPO

## 2023-11-19 DIAGNOSIS — N3001 Acute cystitis with hematuria: Secondary | ICD-10-CM | POA: Insufficient documentation

## 2023-11-19 DIAGNOSIS — Z79899 Other long term (current) drug therapy: Secondary | ICD-10-CM | POA: Diagnosis not present

## 2023-11-19 DIAGNOSIS — R110 Nausea: Secondary | ICD-10-CM | POA: Diagnosis not present

## 2023-11-19 DIAGNOSIS — Z794 Long term (current) use of insulin: Secondary | ICD-10-CM | POA: Diagnosis not present

## 2023-11-19 DIAGNOSIS — Z7982 Long term (current) use of aspirin: Secondary | ICD-10-CM | POA: Insufficient documentation

## 2023-11-19 DIAGNOSIS — R1033 Periumbilical pain: Secondary | ICD-10-CM | POA: Insufficient documentation

## 2023-11-19 DIAGNOSIS — I10 Essential (primary) hypertension: Secondary | ICD-10-CM | POA: Insufficient documentation

## 2023-11-19 DIAGNOSIS — R197 Diarrhea, unspecified: Secondary | ICD-10-CM | POA: Diagnosis not present

## 2023-11-19 DIAGNOSIS — E119 Type 2 diabetes mellitus without complications: Secondary | ICD-10-CM | POA: Insufficient documentation

## 2023-11-19 DIAGNOSIS — R350 Frequency of micturition: Secondary | ICD-10-CM | POA: Diagnosis present

## 2023-11-19 LAB — CBC WITH DIFFERENTIAL/PLATELET
Abs Immature Granulocytes: 0.04 10*3/uL (ref 0.00–0.07)
Basophils Absolute: 0 10*3/uL (ref 0.0–0.1)
Basophils Relative: 0 %
Eosinophils Absolute: 0.1 10*3/uL (ref 0.0–0.5)
Eosinophils Relative: 1 %
HCT: 36.6 % (ref 36.0–46.0)
Hemoglobin: 11.8 g/dL — ABNORMAL LOW (ref 12.0–15.0)
Immature Granulocytes: 0 %
Lymphocytes Relative: 35 %
Lymphs Abs: 3.6 10*3/uL (ref 0.7–4.0)
MCH: 28.2 pg (ref 26.0–34.0)
MCHC: 32.2 g/dL (ref 30.0–36.0)
MCV: 87.4 fL (ref 80.0–100.0)
Monocytes Absolute: 0.5 10*3/uL (ref 0.1–1.0)
Monocytes Relative: 5 %
Neutro Abs: 6.2 10*3/uL (ref 1.7–7.7)
Neutrophils Relative %: 59 %
Platelets: 248 10*3/uL (ref 150–400)
RBC: 4.19 MIL/uL (ref 3.87–5.11)
RDW: 13.6 % (ref 11.5–15.5)
WBC: 10.5 10*3/uL (ref 4.0–10.5)
nRBC: 0 % (ref 0.0–0.2)

## 2023-11-19 LAB — COMPREHENSIVE METABOLIC PANEL
ALT: 15 U/L (ref 0–44)
AST: 15 U/L (ref 15–41)
Albumin: 3.3 g/dL — ABNORMAL LOW (ref 3.5–5.0)
Alkaline Phosphatase: 108 U/L (ref 38–126)
Anion gap: 7 (ref 5–15)
BUN: 12 mg/dL (ref 8–23)
CO2: 24 mmol/L (ref 22–32)
Calcium: 8.6 mg/dL — ABNORMAL LOW (ref 8.9–10.3)
Chloride: 107 mmol/L (ref 98–111)
Creatinine, Ser: 0.75 mg/dL (ref 0.44–1.00)
GFR, Estimated: >60 mL/min (ref >60)
Glucose, Bld: 120 mg/dL — ABNORMAL HIGH (ref 70–99)
Potassium: 3.3 mmol/L — ABNORMAL LOW (ref 3.5–5.1)
Sodium: 138 mmol/L (ref 135–145)
Total Bilirubin: 1.6 mg/dL — ABNORMAL HIGH (ref <1.2)
Total Protein: 7.1 g/dL (ref 6.5–8.1)

## 2023-11-19 LAB — URINALYSIS, W/ REFLEX TO CULTURE (INFECTION SUSPECTED)
Bilirubin Urine: NEGATIVE
Glucose, UA: NEGATIVE mg/dL
Ketones, ur: NEGATIVE mg/dL
Nitrite: NEGATIVE
Protein, ur: >=300 mg/dL — AB
Specific Gravity, Urine: >=1.03 (ref 1.005–1.030)
WBC, UA: >50 WBC/hpf (ref 0–5)
pH: 6 (ref 5.0–8.0)

## 2023-11-19 MED ORDER — CEPHALEXIN 250 MG PO CAPS
500.0000 mg | ORAL_CAPSULE | Freq: Once | ORAL | Status: AC
  Administered 2023-11-19: 500 mg via ORAL
  Filled 2023-11-19: qty 2

## 2023-11-19 MED ORDER — IOHEXOL 300 MG/ML  SOLN
100.0000 mL | Freq: Once | INTRAMUSCULAR | Status: AC | PRN
  Administered 2023-11-19: 100 mL via INTRAVENOUS

## 2023-11-19 MED ORDER — CEPHALEXIN 500 MG PO CAPS: 500.0000 mg | ORAL_CAPSULE | Freq: Two times a day (BID) | ORAL | 0 refills | Status: AC

## 2023-11-19 NOTE — Discharge Instructions (Addendum)
Thank you for allowing [place] to be a part of your care today.  You were evaluated in the ED for increased urination, burning with urination, and abdominal pain.  Your urine shows evidence of infection which we will treat with an oral antibiotic.  This antibiotic has been sent to the pharmacy.  Take as prescribed and for the entire duration, even if your symptoms begin to improve.  You received your first dose while in the ED.    I have also prescribed a medication to help with nausea and/or vomiting.  It is important for you to stay well hydrated to help flush the bladder out.    Your urine was sent for culture.  If a change in treatment needs to be made, you will receive a phone call.   Return to the ED if you develop sudden worsening of your symptoms or if you have new concerns.

## 2023-11-19 NOTE — ED Triage Notes (Addendum)
Yesterday afternoon started having urinary frequency. Burning with urination yesterday. Nausea, abdominal cramping.  Missed 2 days of her antihypertensives.

## 2023-11-19 NOTE — ED Provider Notes (Signed)
[place] [facility] AT [facility] Provider Note   CSN: [id number] Arrival date & time: 11/19/23  1437     History  Chief Complaint  Patient presents with   Urinary Frequency    Chelsey Bray is a 67 y.o. female with past medical history significant for diabetes, hypertension, anemia, obesity, hyperlipidemia, chronic back pain presents to the ED complaining of urinary frequency, urgency, and burning with urination.  Endorses diarrhea, abdominal cramping, and nausea as well.  Her symptoms have been ongoing for the past couple days.  Denies vomiting, fever, chills, hematuria, flank pain.        Home Medications Prior to Admission medications   Medication Sig Start Date End Date Taking? Authorizing Provider  cephALEXin (KEFLEX) 500 MG capsule Take 1 capsule (500 mg total) by mouth 2 (two) times daily for 7 days. 11/19/23 11/26/23 Yes Darnetta Kesselman R, PA-C  aspirin EC 81 MG tablet Take 81 mg by mouth daily.    [provider]  B Complex Vitamins (VITAMIN B COMPLEX PO) Take 1 capsule by mouth daily.    [provider]  benzonatate (TESSALON) 200 MG capsule Take 1 capsule (200 mg total) by mouth 3 (three) times daily as needed for cough. Patient not taking: Reported on 04/08/2021 02/11/14   Gillian Scarce, MD  carvedilol (COREG) 25 MG tablet Take 25 mg by mouth 2 (two) times daily with a meal.    [provider]  Cholecalciferol (VITAMIN D3) 400 units CAPS Take 400 mg by mouth daily.    [provider]  diclofenac (VOLTAREN) 75 MG EC tablet Take 1 tablet (75 mg total) by mouth 2 (two) times daily. Patient not taking: Reported on 04/08/2021 03/28/14   Garnetta Buddy, MD  dicyclomine (BENTYL) 20 MG tablet Take 20 mg by mouth 2 (two) times daily. 02/26/21   [provider]  diphenhydrAMINE (BENADRYL) 25 mg capsule Take 25 mg by mouth every 6 (six) hours as needed for itching.    [provider]  DULoxetine  (CYMBALTA) 60 MG capsule Take 60 mg by mouth daily. 09/23/16   [provider]  empagliflozin (JARDIANCE) 25 MG TABS tablet Take 25 mg by mouth daily.    [provider]  furosemide (LASIX) 40 MG tablet Take 40 mg by mouth daily.    [provider]  gabapentin (NEURONTIN) 300 MG capsule Take 300 mg by mouth at bedtime. 08/26/16   [provider]  hydrALAZINE (APRESOLINE) 100 MG tablet Take 100 mg by mouth 2 (two) times daily.    [provider]  ibuprofen (ADVIL,MOTRIN) 800 MG tablet Take 1 tablet (800 mg total) by mouth 3 (three) times daily. Patient not taking: Reported on 04/08/2021 12/08/17   Rolland Porter, MD  Insulin Pen Needle (NOVOFINE) 32G X 6 MM MISC Use 1 pen needle twice a day Patient taking differently: 1 each by Other route in the morning and at bedtime. 07/30/13   Zanard, Hinton Dyer, MD  methocarbamol (ROBAXIN) 500 MG tablet Take 1 tablet (500 mg total) by mouth 3 (three) times daily between meals as needed. Patient not taking: Reported on 04/08/2021 12/08/17   Rolland Porter, MD  metolazone (ZAROXOLYN) 5 MG tablet Take 5 mg by mouth in the morning.    [provider]  montelukast (SINGULAIR) 10 MG tablet Take 10 mg by mouth at bedtime.    [provider]  pantoprazole (PROTONIX) 40 MG tablet Take 40 mg by mouth daily. 09/30/16  [provider]  potassium chloride SA (KLOR-CON) 20 MEQ tablet Take 20 mEq by mouth daily.    [provider]  pravastatin (PRAVACHOL) 40 MG tablet Take 1 tablet (40 mg total) by mouth every evening. 07/30/13 07/30/14  Gillian Scarce, MD  spironolactone (ALDACTONE) 50 MG tablet Take 50 mg by mouth daily. 04/01/21   [provider]  telmisartan (MICARDIS) 80 MG tablet Take 80 mg by mouth daily.    [provider]  tiZANidine (ZANAFLEX) 4 MG tablet Take 4 mg by mouth at bedtime.    [provider]  traMADol (ULTRAM) 50 MG tablet Take 1 tablet (50 mg total) by mouth every 6  (six) hours as needed. Patient taking differently: Take 50 mg by mouth every 6 (six) hours as needed for moderate pain. 12/08/17   Rolland Porter, MD  TRESIBA FLEXTOUCH 200 UNIT/ML FlexTouch Pen Inject 100 Units into the skin daily. 02/28/21   [provider]  TRULICITY 4.5 MG/0.5ML SOPN Inject 0.5 mLs into the skin once a week. 01/18/21   [provider]  Vilazodone HCl (VIIBRYD) 40 MG TABS Take 40 mg by mouth daily.    [provider]  Vitamin D, Ergocalciferol, (DRISDOL) 1.25 MG (50000 UNIT) CAPS capsule Take 1 capsule by mouth once a week. 04/02/21   [provider]      Allergies    Cinnamon flavoring agent (non-screening) and Ace inhibitors    Review of Systems   Review of Systems  Constitutional:  Negative for chills and fever.  Gastrointestinal:  Positive for abdominal pain, diarrhea and nausea. Negative for vomiting.  Genitourinary:  Positive for dysuria, frequency and urgency. Negative for flank pain and hematuria.    Physical Exam Updated Vital Signs BP (!) 165/77 (BP Location: Right Arm)   Pulse 90   Temp 98.6 F (37 C)   Resp 18   Ht 5\' 4"  (1.626 m)   Wt 121.6 kg   SpO2 100%   BMI 46.00 kg/m  Physical Exam Vitals and nursing note reviewed.  Constitutional:      General: She is not in acute distress.    Appearance: Normal appearance. She is not ill-appearing or diaphoretic.  Cardiovascular:     Rate and Rhythm: Normal rate and regular rhythm.  Pulmonary:     Effort: Pulmonary effort is normal.  Abdominal:     General: Abdomen is flat. Bowel sounds are normal.     Palpations: Abdomen is soft.     Tenderness: There is abdominal tenderness in the periumbilical area.  Skin:    General: Skin is warm and dry.     Capillary Refill: Capillary refill takes less than 2 seconds.  Neurological:     Mental Status: She is alert. Mental status is at baseline.  Psychiatric:        Mood and Affect: Mood normal.        Behavior: Behavior normal.      ED Results / Procedures / Treatments   Labs (all labs ordered are listed, but only abnormal results are displayed) Labs Reviewed  URINALYSIS, W/ REFLEX TO CULTURE (INFECTION SUSPECTED) - Abnormal; Notable for the following components:      Result Value   Hgb urine dipstick LARGE (*)    Protein, ur >=300 (*)    Leukocytes,Ua SMALL (*)    Bacteria, UA FEW (*)    All other components within normal limits  CBC WITH DIFFERENTIAL/PLATELET - Abnormal; Notable for the following components:   Hemoglobin  11.8 (*)    All other components within normal limits  COMPREHENSIVE METABOLIC PANEL - Abnormal; Notable for the following components:   Potassium 3.3 (*)    Glucose, Bld 120 (*)    Calcium 8.6 (*)    Albumin 3.3 (*)    Total Bilirubin 1.6 (*)    All other components within normal limits  URINE CULTURE    EKG None  Radiology CT ABDOMEN PELVIS W CONTRAST Result Date: 11/19/2023 CLINICAL DATA:  Abdominal pain and burning with urination. EXAM: CT ABDOMEN AND PELVIS WITH CONTRAST TECHNIQUE: Multidetector CT imaging of the abdomen and pelvis was performed using the standard protocol following bolus administration of intravenous contrast. RADIATION DOSE REDUCTION: This exam was performed according to the departmental dose-optimization program which includes automated exposure control, adjustment of the mA and/or kV according to patient size and/or use of iterative reconstruction technique. CONTRAST:  OMNIPAQUE IOHEXOL 300 MG/ML  SOLN COMPARISON:  04/10/2022 FINDINGS: Lower chest: The lung bases are clear of acute process. No pleural effusion or pulmonary lesions. The heart is normal in size. No pericardial effusion. The distal esophagus and aorta are unremarkable. Hepatobiliary: No hepatic lesions or intrahepatic biliary dilatation. A few scattered calcified granulomas are noted the gallbladder is surgically absent. No common bile duct dilatation. Pancreas: Unremarkable. No pancreatic  ductal dilatation or surrounding inflammatory changes. Spleen: Normal in size without focal abnormality. Adrenals/Urinary Tract: Adrenal glands and kidneys are unremarkable. No renal or obstructing ureteral calculi. No worrisome renal lesions. No collecting system abnormalities are identified on the delayed images. The bladder is unremarkable. Stomach/Bowel: The stomach, duodenum, small bowel and colon are unremarkable. No acute inflammatory process, mass lesions or obstructive findings. The terminal ileum and appendix are normal. Remote surgical changes involving the bowel. There is an anterior abdominal wall hernia containing part of the transverse colon but no evidence of incarceration or obstruction. Scattered colonic diverticulosis most notably involving the descending and sigmoid colon. Vascular/Lymphatic: Age advanced atherosclerotic calcification involving the aorta and iliac arteries. A right iliac artery stent is noted. No mesenteric or retroperitoneal mass or adenopathy. The major venous structures are patent. Reproductive: Surgically absent. Other: No pelvic mass or adenopathy. No free pelvic fluid collections. No inguinal mass or adenopathy. Musculoskeletal: Surgical changes related to prior scoliosis repair with extensive fusion. Advanced degenerative disc disease and facet disease below the fusion at L4-5 and L5-S1. No acute bony findings. The bony pelvis is intact. IMPRESSION: 1. No acute abdominal/pelvic findings, mass lesions or adenopathy. 2. Anterior abdominal wall hernia containing part of the transverse colon but no evidence of incarceration or obstruction. 3. Status post cholecystectomy. No biliary dilatation. 4. Age advanced atherosclerotic calcification involving the aorta and iliac arteries. 5. Aortic atherosclerosis. Aortic Atherosclerosis (ICD10-I70.0). Electronically Signed   By: Rudie Meyer M.D.   On: 11/19/2023 18:23    Procedures Procedures    Medications Ordered in  ED Medications  cephALEXin (KEFLEX) capsule 500 mg (has no administration in time range)  iohexol (OMNIPAQUE) 300 MG/ML solution 100 mL (100 mLs Intravenous Contrast Given 11/19/23 1736)    ED Course/ Medical Decision Making/ A&P                                 Medical Decision Making Amount and/or Complexity of Data Reviewed Labs: ordered. Radiology: ordered.  Risk Prescription drug management.   This patient presents to the ED with chief complaint(s) of increased urinary frequency/urgency, dysuria,  abdominal cramping, nausea with pertinent past medical history of diabetes, hypertension.  The complaint involves an extensive differential diagnosis and also carries with it a high risk of complications and morbidity.    The differential diagnosis includes UTI, pyelonephritis, viral syndrome, colitis, gastroenteritis   The initial plan is to obtain UA, labs, imaging  Initial Assessment:   Exam significant for mild periumbilical tenderness   Independent ECG/labs interpretation:  The following labs were independently interpreted:  UA with large amount of microscopic hemoglobin and protein.  >50 WBC, but few bacteria present.  Urine sent for culture.    Independent visualization and interpretation of imaging: I independently visualized the following imaging with scope of interpretation limited to determining acute life threatening conditions related to emergency care: CT abdomen/pelvis, which revealed no acute abdominal or pelvic findings.  There is an anterior abdominal wall hernia.    Treatment and Reassessment: Patient given her first dose of cephalexin while in the ED.   Disposition:   Will treat patient for UTI with cephalexin.  She will be notified if treatment needs altering due to culture results.  Encouraged patient to increase oral hydration.  The patient has been appropriately medically screened and/or stabilized in the ED. I have low suspicion for any other emergent  medical condition which would require further screening, evaluation or treatment in the ED or require inpatient management. At time of discharge the patient is hemodynamically stable and in no acute distress. I have discussed work-up results and diagnosis with patient and answered all questions. Patient is agreeable with discharge plan. We discussed strict return precautions for returning to the emergency department and they verbalized understanding.             Final Clinical Impression(s) / ED Diagnoses Final diagnoses:  Acute cystitis with hematuria  Nausea  Periumbilical abdominal pain    Rx / DC Orders ED Discharge Orders          Ordered    cephALEXin (KEFLEX) 500 MG capsule  2 times daily        11/19/23 1846              Lenard Simmer, PA-C 11/19/23 1849    Alvira Monday, MD 11/20/23 1234

## 2023-11-22 LAB — URINE CULTURE: Culture: >=100000 — AB

## 2023-11-23 ENCOUNTER — Telehealth (HOSPITAL_BASED_OUTPATIENT_CLINIC_OR_DEPARTMENT_OTHER): Payer: Self-pay

## 2023-11-23 NOTE — Telephone Encounter (Signed)
Post ED Visit - Positive Culture Follow-up  Culture report reviewed by antimicrobial stewardship pharmacist: [facility] []  [address] Pharm.D. []  Celedonio Miyamoto, Pharm.D., BCPS AQ-ID []  Garvin Fila, Pharm.D., BCPS []  Georgina Pillion, Pharm.D., BCPS []  [place], [address].D., BCPS, AAHIVP []  Estella Husk, Pharm.D., BCPS, AAHIVP []  Lysle Pearl, PharmD, BCPS []  Phillips Climes, PharmD, BCPS [x]  Estill Batten, PharmD, BCCCP []  Verlan Friends, PharmD []  Mervyn Gay, PharmD, BCPS []  Vinnie Level, PharmD  [facility] []  Len Childs, PharmD []  Greer Pickerel, PharmD []  Adalberto Cole, PharmD []  Perlie Gold, Rph []  Lonell Face) Jean Rosenthal, PharmD []  Earl Many, PharmD []  Junita Push, PharmD []  Dorna Leitz, PharmD []  Terrilee Files, PharmD []  Lynann Beaver, PharmD []  Keturah Barre, PharmD []  Loralee Pacas, PharmD []  Bernadene Person, PharmD   Positive urine culture Treated with Cephalexin, organism sensitive to the same and no further patient follow-up is required at this time.  Sandria Senter 11/23/2023, 12:19 PM

## 2024-02-06 DIAGNOSIS — Z95828 Presence of other vascular implants and grafts: Secondary | ICD-10-CM | POA: Insufficient documentation

## 2024-03-01 ENCOUNTER — Ambulatory Visit: Admitting: Podiatry

## 2024-03-01 ENCOUNTER — Encounter: Payer: Self-pay | Admitting: Podiatry

## 2024-03-01 DIAGNOSIS — B351 Tinea unguium: Secondary | ICD-10-CM

## 2024-03-01 DIAGNOSIS — M2042 Other hammer toe(s) (acquired), left foot: Secondary | ICD-10-CM

## 2024-03-01 DIAGNOSIS — M79675 Pain in left toe(s): Secondary | ICD-10-CM

## 2024-03-01 DIAGNOSIS — R600 Localized edema: Secondary | ICD-10-CM | POA: Diagnosis not present

## 2024-03-01 DIAGNOSIS — M2041 Other hammer toe(s) (acquired), right foot: Secondary | ICD-10-CM | POA: Diagnosis not present

## 2024-03-01 DIAGNOSIS — E1142 Type 2 diabetes mellitus with diabetic polyneuropathy: Secondary | ICD-10-CM

## 2024-03-01 DIAGNOSIS — M79674 Pain in right toe(s): Secondary | ICD-10-CM | POA: Diagnosis not present

## 2024-03-01 NOTE — Progress Notes (Signed)
  Subjective:  Patient ID: Chelsey Bray, female    DOB: 07-21-1956,   MRN: [id number]  No chief complaint on file.   68 y.o. female presents for concern of thickened elongated and painful nails that are difficult to trim. Requesting to have them trimmed today. Relates burning and tingling in their feet. Patient is diabetic and last A1c was  Lab Results  Component Value Date   HGBA1C 8.0 (H) 05/13/2014   .   PCP:  Spero Geralds, MD    . Denies any other pedal complaints. Denies n/v/f/c.   Past Medical History:  Diagnosis Date   Anemia    Arthritis    Back pain    Bronchitis    Depression    Diabetes mellitus without complication (HCC)    Hiatal hernia    Hyperlipidemia    Hypertension    Migraine headache    Morbid obesity (HCC)     Objective:  Physical Exam: Vascular: DP/PT pulses 2/4 bilateral. CFT <3 seconds. Absent hair growth on digits. Edema noted to bilateral lower extremities. Xerosis noted bilaterally.  Skin. No lacerations or abrasions bilateral feet. Nails 1-5 bilateral  are thickened discolored and elongated with subungual debris.  Musculoskeletal: MMT 5/5 bilateral lower extremities in DF, PF, Inversion and Eversion. Deceased ROM in DF of ankle joint.  Neurological: Sensation intact to light touch. Protective sensation diminished bilateral.    Assessment:   1. Pain due to onychomycosis of toenails of both feet   2. Type 2 diabetes mellitus with peripheral neuropathy (HCC)   3. Localized edema   4. Hammertoe, bilateral      Plan:  Patient was evaluated and treated and all questions answered. -Discussed and educated patient on diabetic foot care, especially with  regards to the vascular, neurological and musculoskeletal systems.  -Stressed the importance of good glycemic control and the detriment of not  controlling glucose levels in relation to the foot. -Discussed supportive shoes at all times and checking feet regularly.  -DM shoes  ordered -Mechanically debrided all nails 1-5 bilateral using sterile nail nipper and filed with dremel without incident  -Answered all patient questions -Patient to return  in 3 months for at risk foot care -Patient advised to call the office if any problems or questions arise in the meantime.   Louann Sjogren, DPM

## 2024-06-07 ENCOUNTER — Ambulatory Visit: Admitting: Podiatry

## 2024-06-27 ENCOUNTER — Ambulatory Visit: Admitting: Podiatry

## 2024-06-27 ENCOUNTER — Encounter: Payer: Self-pay | Admitting: Podiatry

## 2024-06-27 DIAGNOSIS — B351 Tinea unguium: Secondary | ICD-10-CM | POA: Diagnosis not present

## 2024-06-27 DIAGNOSIS — M79675 Pain in left toe(s): Secondary | ICD-10-CM

## 2024-06-27 DIAGNOSIS — M79674 Pain in right toe(s): Secondary | ICD-10-CM | POA: Diagnosis not present

## 2024-06-27 DIAGNOSIS — Z794 Long term (current) use of insulin: Secondary | ICD-10-CM | POA: Insufficient documentation

## 2024-06-27 DIAGNOSIS — R600 Localized edema: Secondary | ICD-10-CM | POA: Insufficient documentation

## 2024-06-27 DIAGNOSIS — E1142 Type 2 diabetes mellitus with diabetic polyneuropathy: Secondary | ICD-10-CM

## 2024-06-27 DIAGNOSIS — I5030 Unspecified diastolic (congestive) heart failure: Secondary | ICD-10-CM | POA: Insufficient documentation

## 2024-06-27 NOTE — Progress Notes (Signed)
  Subjective:  Patient ID: Chelsey Bray, female    DOB: 16-Aug-1956,   MRN: [id number]  No chief complaint on file.   68 y.o. female presents for concern of thickened elongated and painful nails that are difficult to trim. Requesting to have them trimmed today. Relates burning and tingling in their feet. Patient is diabetic and last A1c was  Lab Results  Component Value Date   HGBA1C 8.0 (H) 05/13/2014   .   PCP:  Ilsa Willis Skene, MD    . Denies any other pedal complaints. Denies n/v/f/c.   Past Medical History:  Diagnosis Date   Anemia    Arthritis    Back pain    Bronchitis    Depression    Diabetes mellitus without complication (HCC)    Hiatal hernia    Hyperlipidemia    Hypertension    Migraine headache    Morbid obesity (HCC)     Objective:  Physical Exam: Vascular: DP/PT pulses 2/4 bilateral. CFT <3 seconds. Absent hair growth on digits. Edema noted to bilateral lower extremities. Xerosis noted bilaterally.  Skin. No lacerations or abrasions bilateral feet. Nails 1-5 bilateral  are thickened discolored and elongated with subungual debris.  Musculoskeletal: MMT 5/5 bilateral lower extremities in DF, PF, Inversion and Eversion. Deceased ROM in DF of ankle joint.  Neurological: Sensation intact to light touch. Protective sensation diminished bilateral.    Assessment:   1. Pain due to onychomycosis of toenails of both feet   2. Type 2 diabetes mellitus with peripheral neuropathy (HCC)       Plan:  Patient was evaluated and treated and all questions answered. -Discussed and educated patient on diabetic foot care, especially with  regards to the vascular, neurological and musculoskeletal systems.  -Stressed the importance of good glycemic control and the detriment of not  controlling glucose levels in relation to the foot. -Discussed supportive shoes at all times and checking feet regularly.  -Mechanically debrided all nails 1-5 bilateral using  sterile nail nipper and filed with dremel without incident  -Answered all patient questions -Patient to return  in 3 months for at risk foot care -Patient advised to call the office if any problems or questions arise in the meantime.   Asberry Failing, DPM

## 2024-09-27 ENCOUNTER — Ambulatory Visit: Admitting: Podiatry

## 2024-09-27 DIAGNOSIS — Z91199 Patient's noncompliance with other medical treatment and regimen due to unspecified reason: Secondary | ICD-10-CM

## 2024-09-27 NOTE — Progress Notes (Signed)
 Cancel 24 hours

## 2024-11-08 ENCOUNTER — Ambulatory Visit: Admitting: Podiatry

## 2024-12-02 ENCOUNTER — Ambulatory Visit: Admitting: Podiatry

## 2024-12-26 ENCOUNTER — Ambulatory Visit (INDEPENDENT_AMBULATORY_CARE_PROVIDER_SITE_OTHER): Payer: Self-pay | Admitting: Podiatry

## 2024-12-26 DIAGNOSIS — Z91199 Patient's noncompliance with other medical treatment and regimen due to unspecified reason: Secondary | ICD-10-CM

## 2024-12-26 NOTE — Progress Notes (Signed)
 No show

## 2025-01-17 ENCOUNTER — Ambulatory Visit: Admitting: Podiatry
